# Patient Record
Sex: Male | Born: 1975 | Race: Black or African American | Hispanic: No | Marital: Single | Smoking: Never smoker
Health system: Southern US, Community
[De-identification: ages and names within clinical notes are randomized; demographics above are authoritative.]

## PROBLEM LIST (undated history)

## (undated) DIAGNOSIS — F909 Attention-deficit hyperactivity disorder, unspecified type: Secondary | ICD-10-CM

## (undated) DIAGNOSIS — E079 Disorder of thyroid, unspecified: Secondary | ICD-10-CM

## (undated) DIAGNOSIS — E039 Hypothyroidism, unspecified: Secondary | ICD-10-CM

## (undated) DIAGNOSIS — I10 Essential (primary) hypertension: Secondary | ICD-10-CM

## (undated) HISTORY — DX: Essential (primary) hypertension: I10

## (undated) HISTORY — DX: Disorder of thyroid, unspecified: E07.9

## (undated) HISTORY — PX: OTHER SURGICAL HISTORY: SHX169

---

## 1999-09-27 ENCOUNTER — Emergency Department (HOSPITAL_COMMUNITY): Admission: EM | Admit: 1999-09-27 | Discharge: 1999-09-27 | Payer: Self-pay | Admitting: Emergency Medicine

## 2008-07-31 ENCOUNTER — Emergency Department (HOSPITAL_COMMUNITY): Admission: EM | Admit: 2008-07-31 | Discharge: 2008-07-31 | Payer: Self-pay | Admitting: Emergency Medicine

## 2011-02-14 ENCOUNTER — Emergency Department (HOSPITAL_COMMUNITY)
Admission: EM | Admit: 2011-02-14 | Discharge: 2011-02-14 | Disposition: A | Discharge status: Home / Self Care | Payer: BC Managed Care – PPO | Attending: Emergency Medicine | Admitting: Emergency Medicine

## 2011-02-14 DIAGNOSIS — S61209A Unspecified open wound of unspecified finger without damage to nail, initial encounter: Secondary | ICD-10-CM | POA: Insufficient documentation

## 2011-02-14 DIAGNOSIS — I10 Essential (primary) hypertension: Secondary | ICD-10-CM | POA: Insufficient documentation

## 2011-02-14 DIAGNOSIS — S51809A Unspecified open wound of unspecified forearm, initial encounter: Secondary | ICD-10-CM | POA: Insufficient documentation

## 2011-02-14 DIAGNOSIS — W540XXA Bitten by dog, initial encounter: Secondary | ICD-10-CM | POA: Insufficient documentation

## 2011-04-08 ENCOUNTER — Ambulatory Visit (INDEPENDENT_AMBULATORY_CARE_PROVIDER_SITE_OTHER): Payer: BC Managed Care – PPO | Admitting: Surgery

## 2011-04-08 ENCOUNTER — Other Ambulatory Visit (INDEPENDENT_AMBULATORY_CARE_PROVIDER_SITE_OTHER): Payer: Self-pay | Admitting: Surgery

## 2011-04-08 ENCOUNTER — Encounter (INDEPENDENT_AMBULATORY_CARE_PROVIDER_SITE_OTHER): Payer: Self-pay | Admitting: Surgery

## 2011-04-08 VITALS — BP 144/102 | HR 108 | Temp 97.2°F | Resp 28 | Ht 74.0 in | Wt 360.0 lb

## 2011-04-08 DIAGNOSIS — L02219 Cutaneous abscess of trunk, unspecified: Secondary | ICD-10-CM

## 2011-04-08 DIAGNOSIS — L02214 Cutaneous abscess of groin: Secondary | ICD-10-CM

## 2011-04-08 MED ORDER — AMOXICILLIN-POT CLAVULANATE 875-125 MG PO TABS: 1.0000 | ORAL_TABLET | Freq: Two times a day (BID) | ORAL | Status: AC

## 2011-04-08 NOTE — Patient Instructions (Signed)
We will start you on some antibiotics today. He will need to come back tomorrow for my nurse to change her dressing. We will likely have to see her next week as well.

## 2011-04-08 NOTE — Progress Notes (Signed)
  CC: Abscess HPI: The patient comes over from urgent care with a left groin abscess. He was seen here yesterday and started on an antibiotic injection. He was not given any oral antibiotics. A sterile dressing was applied he was sent here he notes he said continued foul-smelling drainage from the area. As on the left side really in the inguinal crease somewhat posterior to base of the scrotum. He's not had any systemic symptoms.   ROS: He has hypertension but other than that his review of systems is negative.  MEDS: Current Outpatient Prescriptions  Medication Sig Dispense Refill  . allopurinol (ZYLOPRIM) 300 MG tablet daily.      Marland Kitchen amLODipine-benazepril (LOTREL) 5-40 MG per capsule           ALLERGIES: Allergies  Allergen Reactions  . Micardis (Telmisartan)       PE General: The patient alert oriented and healthy-appearing. He is morbidly obese.  Groin: He has an indurated area in the left groin that is draining purulent material. It is in the inguinal crease fairly posteriorly basically at the base of the scrotum and junction with the thigh. There is some mild induration around this..  Data Reviewed No data available  Assessment Abscess and left groin area. I think this is related to air follicle or cysts  Plan I recommended that we drain this. He was agreeable.  Procedure note: The area is anesthetized with about 8 cc of 1% Xylocaine with epinephrine. I waited about 10 minutes. I had excellent anesthesia. The area was prepped with Betadine. I made an elliptical incision opened up the area. Tract a little bit inferiorly posteriorly and superiorly along the inguinal canal. It was beaded [place] anticipated I went ahead and opened up the skin for several more centimeters so I had wide open drainage.  A culture was taken  The wound is packed. Going to start him on Augmentin 875 b.i.d.

## 2011-04-09 ENCOUNTER — Ambulatory Visit (INDEPENDENT_AMBULATORY_CARE_PROVIDER_SITE_OTHER): Payer: BC Managed Care – PPO | Admitting: General Surgery

## 2011-04-09 VITALS — BP 130/78 | HR 70 | Temp 97.1°F | Resp 16 | Ht 73.0 in | Wt 358.0 lb

## 2011-04-09 DIAGNOSIS — L02214 Cutaneous abscess of groin: Secondary | ICD-10-CM

## 2011-04-09 DIAGNOSIS — L02219 Cutaneous abscess of trunk, unspecified: Secondary | ICD-10-CM

## 2011-04-09 NOTE — Patient Instructions (Signed)
Continue antibiotics.  Change gauze as needed

## 2011-04-09 NOTE — Progress Notes (Signed)
Subjective:     Patient ID: Francisco Green, male   DOB: 1976-01-05, 35 y.o.   MRN: [id number]  HPI patient presents for followup status post incision and drainage of left groin abscess yesterday. He has been taking Augmentin. He continues to have some drainage.  Review of Systems     Objective:   Physical Exam Packing was removed. It was cleaned out. There is no significant ongoing induration. There is no erythema. There continues to be some purulent drainage. A new wet-to-dry dressing was placed.    Assessment:     Left groin abscess improving after incision and drainage    Plan:     Continue antibiotics. We will have the patient to Dr. Zachery Dakins tomorrow morning. After that I believe the patient will be able to handle his dressing changes.

## 2011-04-10 ENCOUNTER — Ambulatory Visit (INDEPENDENT_AMBULATORY_CARE_PROVIDER_SITE_OTHER): Payer: BC Managed Care – PPO | Admitting: Surgery

## 2011-04-10 ENCOUNTER — Encounter (INDEPENDENT_AMBULATORY_CARE_PROVIDER_SITE_OTHER): Payer: Self-pay | Admitting: Surgery

## 2011-04-10 ENCOUNTER — Encounter (INDEPENDENT_AMBULATORY_CARE_PROVIDER_SITE_OTHER): Payer: BC Managed Care – PPO | Admitting: General Surgery

## 2011-04-10 VITALS — BP 122/86 | HR 96 | Temp 97.0°F | Resp 20 | Ht 74.0 in | Wt 356.0 lb

## 2011-04-10 DIAGNOSIS — L02219 Cutaneous abscess of trunk, unspecified: Secondary | ICD-10-CM

## 2011-04-10 DIAGNOSIS — L02214 Cutaneous abscess of groin: Secondary | ICD-10-CM

## 2011-04-10 NOTE — Patient Instructions (Signed)
We will need to recheck you in the office Monday. You may shower and change the dressing as needed. Continue your antibiotics

## 2011-04-10 NOTE — Progress Notes (Signed)
The patient comes back in followup his left groin abscess. This was located in the groin crease at the bottom of the scrotum on the left side. He was seen yesterday for followup and comes back for a second followup today. It was drained two days ago.  Patient feels much better than he did on Wednesday when we drained the abscess. He continues to have foul-smelling drainage.  Exam: Gen.: The patient is alert and comfortable and in no distress vital signs normal. Groin: There is a draining abscess in the groin area. The pocket is about two or 3 cm deep. However, I think the incision has a widely open and this should gradually resolve. Data reviewed culture shows abundant gram-negative rods and a few gram-positive rods and gram-positive cocci in pairs. Impression: Gradually improving status post drainage of groin abscess  Plan: He will do dressing changes as needed over the weekend and he needs to be rechecked on Monday. We'll also have final culture reports them.

## 2011-04-11 LAB — WOUND CULTURE

## 2011-04-13 ENCOUNTER — Ambulatory Visit (INDEPENDENT_AMBULATORY_CARE_PROVIDER_SITE_OTHER): Payer: BC Managed Care – PPO | Admitting: Surgery

## 2011-04-13 ENCOUNTER — Encounter (INDEPENDENT_AMBULATORY_CARE_PROVIDER_SITE_OTHER): Payer: Self-pay | Admitting: Surgery

## 2011-04-13 VITALS — BP 124/81 | HR 70 | Temp 97.5°F | Resp 13 | Ht 74.0 in | Wt 363.0 lb

## 2011-04-13 DIAGNOSIS — L02219 Cutaneous abscess of trunk, unspecified: Secondary | ICD-10-CM

## 2011-04-13 DIAGNOSIS — L02214 Cutaneous abscess of groin: Secondary | ICD-10-CM

## 2011-04-13 NOTE — Progress Notes (Signed)
Subjective:     Patient ID: Francisco Green, male   DOB: [birth year], 36 y.o.   MRN: [id number]  HPI He has no complaints today. He reports no groin pain or fevers. He is finishing his antibiotics. He has had no issues with dressing changes  Review of Systems     Objective:   Physical Exam On exam, his wound is well healing with excellent granulation tissue and no purulence. The wound cultures were nonspecific    Assessment:     Left groin abscess status post incision and drainage    Plan:     He will continue his current wound care and finish his antibiotics. He will see [place] back as needed.

## 2011-04-17 ENCOUNTER — Encounter (INDEPENDENT_AMBULATORY_CARE_PROVIDER_SITE_OTHER): Payer: BC Managed Care – PPO | Admitting: Surgery

## 2012-09-26 ENCOUNTER — Other Ambulatory Visit: Payer: Self-pay | Admitting: Endocrinology

## 2012-09-26 DIAGNOSIS — E041 Nontoxic single thyroid nodule: Secondary | ICD-10-CM

## 2012-10-03 ENCOUNTER — Other Ambulatory Visit: Payer: BC Managed Care – PPO

## 2012-10-31 ENCOUNTER — Ambulatory Visit
Admission: RE | Admit: 2012-10-31 | Discharge: 2012-10-31 | Disposition: A | Discharge status: Home / Self Care | Payer: BC Managed Care – PPO | Source: Ambulatory Visit | Attending: Endocrinology | Admitting: Endocrinology

## 2012-10-31 DIAGNOSIS — E041 Nontoxic single thyroid nodule: Secondary | ICD-10-CM

## 2012-11-03 ENCOUNTER — Other Ambulatory Visit: Payer: Self-pay | Admitting: Endocrinology

## 2012-11-03 DIAGNOSIS — E041 Nontoxic single thyroid nodule: Secondary | ICD-10-CM

## 2013-01-10 ENCOUNTER — Ambulatory Visit
Admission: RE | Admit: 2013-01-10 | Discharge: 2013-01-10 | Disposition: A | Discharge status: Home / Self Care | Payer: BC Managed Care – PPO | Source: Ambulatory Visit | Attending: Endocrinology | Admitting: Endocrinology

## 2013-01-10 ENCOUNTER — Other Ambulatory Visit (HOSPITAL_COMMUNITY)
Admission: RE | Admit: 2013-01-10 | Discharge: 2013-01-10 | Disposition: A | Discharge status: Home / Self Care | Payer: BC Managed Care – PPO | Source: Ambulatory Visit | Attending: Interventional Radiology | Admitting: Interventional Radiology

## 2013-01-10 DIAGNOSIS — E049 Nontoxic goiter, unspecified: Secondary | ICD-10-CM | POA: Insufficient documentation

## 2013-01-10 DIAGNOSIS — E041 Nontoxic single thyroid nodule: Secondary | ICD-10-CM

## 2013-01-12 ENCOUNTER — Other Ambulatory Visit: Payer: BC Managed Care – PPO

## 2013-09-21 ENCOUNTER — Encounter (INDEPENDENT_AMBULATORY_CARE_PROVIDER_SITE_OTHER): Payer: Self-pay | Admitting: General Surgery

## 2013-09-21 ENCOUNTER — Other Ambulatory Visit (INDEPENDENT_AMBULATORY_CARE_PROVIDER_SITE_OTHER): Payer: Self-pay | Admitting: General Surgery

## 2013-09-21 ENCOUNTER — Ambulatory Visit (INDEPENDENT_AMBULATORY_CARE_PROVIDER_SITE_OTHER): Payer: BC Managed Care – PPO | Admitting: General Surgery

## 2013-09-21 ENCOUNTER — Encounter (INDEPENDENT_AMBULATORY_CARE_PROVIDER_SITE_OTHER): Payer: Self-pay

## 2013-09-21 VITALS — BP 148/88 | HR 88 | Temp 98.8°F | Resp 20 | Ht 74.0 in | Wt 382.0 lb

## 2013-09-21 DIAGNOSIS — M109 Gout, unspecified: Secondary | ICD-10-CM | POA: Insufficient documentation

## 2013-09-21 DIAGNOSIS — I10 Essential (primary) hypertension: Secondary | ICD-10-CM

## 2013-09-21 DIAGNOSIS — E049 Nontoxic goiter, unspecified: Secondary | ICD-10-CM

## 2013-09-21 DIAGNOSIS — E039 Hypothyroidism, unspecified: Secondary | ICD-10-CM | POA: Insufficient documentation

## 2013-09-21 NOTE — Patient Instructions (Signed)
We will start our process for workup Please watch EMMI video on gastric bypass  Please call me with any questions

## 2013-09-22 NOTE — Progress Notes (Signed)
Patient ID: Francisco Green, male   DOB: 02/28/1976, 38 y.o.   MRN: [id number]  Chief Complaint  Patient presents with  . Weight Loss Surgery    initial bariatric possible bypass    HPI Francisco PryKavon K Fatica is a 38 y.o. male.   HPI 38 year old morbidly obese African American male referred by Dr. Juleen ChinaKohut for evaluation of weight loss surgery. The patient states that he has struggled with his weight most of his adult life. Despite numerous attempts were sustained weight loss he has been unsuccessful. He has tried [employer] diet, personal training, and Nutrisystem all without any long-term success. He is primarily interested in the gastric bypass because it does not require adjustment like a lap band procedure. He also has had friends who have had a gastric bypass with good outcomes.  His comorbidities include hypertension, gout, mild reflux, Hypothyroidism with goiter Past Medical History  Diagnosis Date  . Hypertension   . Gout   . Thyroid disease     Past Surgical History  Procedure Laterality Date  . Groin abs      History reviewed. No pertinent family history.  Social History History  Substance Use Topics  . Smoking status: Never Smoker   . Smokeless tobacco: Never Used  . Alcohol Use: No    Allergies  Allergen Reactions  . Micardis [Telmisartan]     Current Outpatient Prescriptions  Medication Sig Dispense Refill  . allopurinol (ZYLOPRIM) 300 MG tablet daily.      Marland Kitchen. amLODipine-benazepril (LOTREL) 5-40 MG per capsule       . levothyroxine (SYNTHROID, LEVOTHROID) 88 MCG tablet Take 88 mcg by mouth daily before breakfast.       No current facility-administered medications for this visit.    Review of Systems Review of Systems  Constitutional: Negative for fever, chills, appetite change and unexpected weight change.  HENT: Negative for congestion and trouble swallowing.   Eyes: Negative for visual disturbance.  Respiratory: Negative for chest tightness and shortness of  breath.        Epworth score 7  Cardiovascular: Negative for chest pain and leg swelling.       No PND, no orthopnea, no DOE; elevated lipids in past but no meds  Gastrointestinal: Negative for nausea, vomiting, abdominal pain, diarrhea and constipation.       Occasional GERD but no Rx meds.   Genitourinary: Negative for dysuria and hematuria.  Musculoskeletal: Negative.   Skin: Negative for rash.  Neurological: Negative for seizures and speech difficulty.  Hematological: Does not bruise/bleed easily.  Psychiatric/Behavioral: Negative for behavioral problems and confusion.    Blood pressure 148/88, pulse 88, temperature 98.8 F (37.1 C), temperature source Temporal, resp. rate 20, height 6\' 2"  (1.88 m), weight 382 lb (173.274 kg).  Physical Exam Physical Exam  Vitals reviewed. Constitutional: He is oriented to person, place, and time. He appears well-developed and well-nourished. No distress.  Morbidly obese, evenly distributed  HENT:  Head: Normocephalic and atraumatic.  Right Ear: External ear normal.  Left Ear: External ear normal.  Eyes: Conjunctivae are normal. No scleral icterus.  Neck: Normal range of motion. Neck supple. No tracheal deviation present. No thyromegaly present.  Cardiovascular: Normal rate, normal heart sounds and intact distal pulses.   Pulmonary/Chest: Effort normal and breath sounds normal. No respiratory distress. He has no wheezes.  Musculoskeletal: Normal range of motion. He exhibits no edema and no tenderness.  Lymphadenopathy:    He has no cervical adenopathy.  Neurological: He is alert and  oriented to person, place, and time. He exhibits normal muscle tone.  Skin: Skin is warm and dry. No rash noted. He is not diaphoretic. No erythema. No pallor.  Psychiatric: He has a normal mood and affect. His behavior is normal. Judgment and thought content normal.    Data Reviewed U/s neck and thyroid bx - goiter  Assessment    Morbid obesity BMI  49 Hypertension Gout Hypothyroidism Goiter     Plan    The patient meets weight loss surgery criteria. I think the patient would be an acceptable candidate for Laparoscopic Roux-en-Y Gastric bypass.   We discussed laparoscopic Roux-en-Y gastric bypass. We discussed the preoperative, operative and postoperative process. Using diagrams, I explained the surgery in detail including the performance of an EGD near the end of the surgery and an Upper GI swallow study on POD 1. We discussed the typical hospital course including a 2-3 day stay baring any complications.   The patient was given educational material. I quoted the patient that they can expect to lose 50-70% of their excess weight with the gastric bypass. We did discuss the possibility of weight regain several years after the procedure.  We discussed the risk and benefits of surgery including but not limited to anesthesia risk, bleeding, infection, anastomotic edema requiring a few additional days in the hospital, postop nausea, possible conversion to open procedure, blood clot formation, anastomotic leak, anastomotic stricture, ulcer formation, death, respiratory complications, intestinal blockage, internal hernia, gallstone formation, vitamin and nutritional deficiencies, hair loss, weight regain injury to surrounding structures, failure to lose weight and mood changes.  We discussed that before and after surgery that there would be an alteration in their diet. I explained that we have put them on a diet 2 weeks before surgery. I also explained that they would be on a liquid diet for 2 weeks after surgery. We discussed that they would have to avoid certain foods such as sugar after surgery. We discussed the importance of physical activity as well as compliance with our dietary and supplement recommendations and routine follow-up.  I explained to the patient that we will start our evaluation process which includes labs, Upper GI to evaluate  stomach and swallowing anatomy, nutritionist consultation, psychiatrist consultation, EKG, CXR, abdominal ultrasound, repeat neck ultrasound. He was also given access to watch the EMMI video on laparoscopic Roux-en-Y gastric bypass.  Mary Sella. Andrey Campanile, MD, FACS General, Bariatric, & Minimally Invasive Surgery [facility], [place]         [facility] M 09/22/2013, 3:44 PM

## 2013-09-26 ENCOUNTER — Other Ambulatory Visit: Payer: BC Managed Care – PPO

## 2013-09-30 LAB — LIPID PANEL
Cholesterol: 190 mg/dL (ref 0–200)
HDL: 40 mg/dL (ref >39)
LDL CALC: 132 mg/dL — AB (ref 0–99)
Total CHOL/HDL Ratio: 4.8 Ratio
Triglycerides: 91 mg/dL (ref <150)
VLDL: 18 mg/dL (ref 0–40)

## 2013-09-30 LAB — CBC
HEMATOCRIT: 43.7 % (ref 39.0–52.0)
Hemoglobin: 14 g/dL (ref 13.0–17.0)
MCH: 22.9 pg — ABNORMAL LOW (ref 26.0–34.0)
MCHC: 32 g/dL (ref 30.0–36.0)
MCV: 71.4 fL — ABNORMAL LOW (ref 78.0–100.0)
Platelets: 126 10*3/uL — ABNORMAL LOW (ref 150–400)
RBC: 6.12 MIL/uL — ABNORMAL HIGH (ref 4.22–5.81)
RDW: 13.8 % (ref 11.5–15.5)
WBC: 3.6 10*3/uL — AB (ref 4.0–10.5)

## 2013-09-30 LAB — T4: T4 TOTAL: 12.5 ug/dL (ref 5.0–12.5)

## 2013-09-30 LAB — TSH: TSH: 0.486 u[IU]/mL (ref 0.350–4.500)

## 2013-10-02 LAB — H. PYLORI ANTIBODY, IGG: H PYLORI IGG: 0.49 {ISR}

## 2013-10-23 ENCOUNTER — Ambulatory Visit
Admission: RE | Admit: 2013-10-23 | Discharge: 2013-10-23 | Disposition: A | Discharge status: Home / Self Care | Payer: BC Managed Care – PPO | Source: Ambulatory Visit | Attending: General Surgery | Admitting: General Surgery

## 2013-10-24 ENCOUNTER — Ambulatory Visit (HOSPITAL_COMMUNITY)
Admission: RE | Admit: 2013-10-24 | Discharge: 2013-10-24 | Disposition: A | Discharge status: Home / Self Care | Payer: BC Managed Care – PPO | Source: Ambulatory Visit | Attending: General Surgery | Admitting: General Surgery

## 2013-10-24 ENCOUNTER — Other Ambulatory Visit: Payer: Self-pay

## 2013-10-24 DIAGNOSIS — Z6841 Body Mass Index (BMI) 40.0 and over, adult: Secondary | ICD-10-CM | POA: Insufficient documentation

## 2013-10-24 DIAGNOSIS — K449 Diaphragmatic hernia without obstruction or gangrene: Secondary | ICD-10-CM | POA: Insufficient documentation

## 2013-10-24 DIAGNOSIS — K7689 Other specified diseases of liver: Secondary | ICD-10-CM | POA: Insufficient documentation

## 2013-10-24 DIAGNOSIS — E049 Nontoxic goiter, unspecified: Secondary | ICD-10-CM | POA: Insufficient documentation

## 2013-10-24 DIAGNOSIS — E039 Hypothyroidism, unspecified: Secondary | ICD-10-CM | POA: Insufficient documentation

## 2013-10-24 DIAGNOSIS — K224 Dyskinesia of esophagus: Secondary | ICD-10-CM | POA: Insufficient documentation

## 2013-10-24 DIAGNOSIS — M109 Gout, unspecified: Secondary | ICD-10-CM | POA: Insufficient documentation

## 2013-10-24 DIAGNOSIS — I10 Essential (primary) hypertension: Secondary | ICD-10-CM | POA: Insufficient documentation

## 2013-11-01 ENCOUNTER — Ambulatory Visit: Payer: BC Managed Care – PPO | Admitting: Dietician

## 2014-04-07 ENCOUNTER — Emergency Department (HOSPITAL_COMMUNITY)
Admission: EM | Admit: 2014-04-07 | Discharge: 2014-04-07 | Disposition: A | Discharge status: Home / Self Care | Payer: BC Managed Care – PPO | Attending: Emergency Medicine | Admitting: Emergency Medicine

## 2014-04-07 ENCOUNTER — Encounter (HOSPITAL_COMMUNITY): Payer: Self-pay | Admitting: Emergency Medicine

## 2014-04-07 DIAGNOSIS — Z79899 Other long term (current) drug therapy: Secondary | ICD-10-CM | POA: Diagnosis not present

## 2014-04-07 DIAGNOSIS — R6889 Other general symptoms and signs: Secondary | ICD-10-CM | POA: Insufficient documentation

## 2014-04-07 DIAGNOSIS — I10 Essential (primary) hypertension: Secondary | ICD-10-CM | POA: Insufficient documentation

## 2014-04-07 DIAGNOSIS — E079 Disorder of thyroid, unspecified: Secondary | ICD-10-CM | POA: Insufficient documentation

## 2014-04-07 DIAGNOSIS — J392 Other diseases of pharynx: Secondary | ICD-10-CM

## 2014-04-07 DIAGNOSIS — M109 Gout, unspecified: Secondary | ICD-10-CM | POA: Diagnosis not present

## 2014-04-07 MED ORDER — GI COCKTAIL ~~LOC~~
30.0000 mL | Freq: Once | ORAL | Status: AC
  Administered 2014-04-07: 30 mL via ORAL
  Filled 2014-04-07: qty 30

## 2014-04-07 NOTE — ED Provider Notes (Signed)
CSN: [id number]     Arrival date & time 04/07/14  2110 History   First MD Initiated Contact with Patient 04/07/14 2147     Chief Complaint  Patient presents with  . Foreign Body     (Consider location/radiation/quality/duration/timing/severity/associated sxs/prior Treatment) HPI Francisco Green is a 38 y.o. male who comes in for evaluation after swallowing his blood pressure pill and feeling like it got lodged in his throat. Patient states he took his blood pressure pill around 8:45 PM this evening he just felt like it got turned sideways his throat and got lodged there. He is not having any difficulties breathing, he is speaking in full sentences without discomfort or difficulty, he is tolerating his secretions well. Denies any fevers, nausea vomiting, shortness of breath, abdominal pain, numbness or tingling.    Past Medical History  Diagnosis Date  . Hypertension   . Gout   . Thyroid disease    Past Surgical History  Procedure Laterality Date  . Groin abs     No family history on file. History  Substance Use Topics  . Smoking status: Never Smoker   . Smokeless tobacco: Never Used  . Alcohol Use: No    Review of Systems  Constitutional: Negative for fever.  HENT: Positive for trouble swallowing.   Respiratory: Negative for shortness of breath.   Cardiovascular: Negative for chest pain.  Skin: Negative for rash.      Allergies  Micardis  Home Medications   Prior to Admission medications   Medication Sig Start Date End Date Taking? Authorizing Provider  allopurinol (ZYLOPRIM) 300 MG tablet Take 300 mg by mouth daily.  03/08/11  Yes Historical Provider, MD  amLODipine-benazepril (LOTREL) 5-40 MG per capsule Take 1 capsule by mouth daily.  03/08/11  Yes Historical Provider, MD  levothyroxine (SYNTHROID, LEVOTHROID) 88 MCG tablet Take 88 mcg by mouth daily before breakfast.   Yes Historical Provider, MD   BP 170/102  Pulse 91  Temp(Src) 99.3 F (37.4 C) (Oral)  Resp  20  Ht  (1.88 m)  Wt 382 lb (173.274 kg)  BMI 49.03 kg/m2  SpO2 100% Physical Exam  Nursing note and vitals reviewed. Constitutional:  Awake, alert, nontoxic appearance.  HENT:  Head: Atraumatic.  Mouth/Throat: Oropharynx is clear and moist. No oropharyngeal exudate.  No evidence of posterior oropharynx swelling. No evidence of pill or foreign body visible on inspection. No signs of trauma.  Eyes: Right eye exhibits no discharge. Left eye exhibits no discharge.  Neck: Neck supple.  Pulmonary/Chest: Effort normal. He exhibits no tenderness.  Abdominal: Soft. There is no tenderness. There is no rebound.  Musculoskeletal: He exhibits no tenderness.  Baseline ROM, no obvious new focal weakness.  Neurological:  Mental status and motor strength appears baseline for patient and situation.  Skin: No rash noted.  Psychiatric: He has a normal mood and affect.    ED Course  Procedures (including critical care time) Labs Review Labs Reviewed - No data to display  Imaging Review No results found.   EKG Interpretation None     Meds given in ED:  Medications  gi cocktail (Maalox,Lidocaine,Donnatal) (30 mLs Oral Given 04/07/14 2202)    New Prescriptions   No medications on file   Filed Vitals:   04/07/14 2126  BP: 170/102  Pulse: 91  Temp: 99.3 F (37.4 C)  TempSrc: Oral  Resp: 20  Height:  (1.88 m)  Weight: 382 lb (173.274 kg)  SpO2: 100%  MDM  Vitals stable - WNL -afebrile Pt resting comfortably in ED. patient given GI cocktail, states he feels much better. He no longer feels like anything is lodged in his throat and he asks if he can go home now. He is drinking water in his room without any difficulty or discomfort. PE not concerning for obstructive foreign body. No evidence of respiratory compromise Discussed f/u with PCP and return precautions, pt very amenable to plan.   Final diagnoses:  Throat irritation   Prior to patient discharge, I discussed  and reviewed this case with Dr.Harrison         Sharlene Motts, PA-C 04/08/14 1035

## 2014-04-07 NOTE — Discharge Instructions (Signed)
You were seen in the ED for evaluation of throat discomfort after swallowing a pill. Your condition improved while in the ED. There is no evidence of respiratory compromise. If you begin to feel short of breath chest pain or fevers return to ED for further evaluation

## 2014-04-07 NOTE — ED Notes (Addendum)
Pt states he was attempting to take his medication this evening 2100, felt pill get stuck in throat, pt states he has had this happen in the past d/t enlarged Thyroid. Pt handling secretions and is able to swallow water. No airway involvement

## 2014-04-08 NOTE — ED Provider Notes (Signed)
Medical screening examination/treatment/procedure(s) were conducted as a shared visit with non-physician practitioner(s) and myself.  I personally evaluated the patient during the encounter.   EKG Interpretation None      I interviewed and examined the patient. Lungs are CTAB. Cardiac exam wnl. Abdomen soft.  Pt well appearing, no acute distress, likely pill esophagitis. Will recommend symptomatic mgmt.   Purvis Sheffield, MD 04/08/14 1051

## 2016-07-20 HISTORY — PX: OTHER SURGICAL HISTORY: SHX169

## 2016-09-02 ENCOUNTER — Other Ambulatory Visit: Payer: Self-pay | Admitting: Endocrinology

## 2016-09-02 DIAGNOSIS — E041 Nontoxic single thyroid nodule: Secondary | ICD-10-CM

## 2016-09-29 ENCOUNTER — Inpatient Hospital Stay: Admission: RE | Admit: 2016-09-29 | Payer: Self-pay | Source: Ambulatory Visit

## 2016-12-01 ENCOUNTER — Encounter (HOSPITAL_COMMUNITY): Payer: Self-pay | Admitting: *Deleted

## 2016-12-01 ENCOUNTER — Ambulatory Visit (HOSPITAL_COMMUNITY)
Admission: EM | Admit: 2016-12-01 | Discharge: 2016-12-01 | Disposition: A | Discharge status: Home / Self Care | Payer: 59 | Attending: Internal Medicine | Admitting: Internal Medicine

## 2016-12-01 DIAGNOSIS — M7711 Lateral epicondylitis, right elbow: Secondary | ICD-10-CM | POA: Diagnosis not present

## 2016-12-01 MED ORDER — MELOXICAM 15 MG PO TABS: 15.0000 mg | ORAL_TABLET | Freq: Every day | ORAL | 2 refills | Status: DC

## 2016-12-01 NOTE — ED Triage Notes (Signed)
Patient states right intermittent elbow pain with movement and palpation, no injury. CMS intact distally to pain.

## 2016-12-01 NOTE — ED Provider Notes (Signed)
CSN: [id number]     Arrival date & time 12/01/16  1420 History   First MD Initiated Contact with Patient 12/01/16 1542     Chief Complaint  Patient presents with  . Elbow Pain   (Consider location/radiation/quality/duration/timing/severity/associated sxs/prior Treatment) 41 year old male with past history of gout, hypertension, thyroid disease, and obesity presents to clinic with right elbow pain, ongoing for 2 weeks. States the pain is been worsening, needs noticed some swelling to the elbow. He denies any traumatic cause, such as falls, MVC, assault, or other injuries. 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 Pain subsides over the weekends, and worsens over the course of the week. It is relieved with ibuprofen, and worsened with movement.   The history is provided by the patient.    Past Medical History:  Diagnosis Date  . Gout   . Hypertension   . Thyroid disease    Past Surgical History:  Procedure Laterality Date  . groin abs     Family History  Problem Relation Age of Onset  . Hypertension Mother   . Diabetes Father   . Hypertension Father    Social History  Substance Use Topics  . Smoking status: Never Smoker  . Smokeless tobacco: Never Used  . Alcohol use No    Review of Systems  Constitutional: Negative.   HENT: Negative.   Respiratory: Negative.   Cardiovascular: Negative.   Gastrointestinal: Negative.   Musculoskeletal: Positive for joint swelling.  Skin: Negative.   Neurological: Negative.     Allergies  Micardis [telmisartan]  Home Medications   Prior to Admission medications   Medication Sig Start Date End Date Taking? Authorizing Provider  allopurinol (ZYLOPRIM) 300 MG tablet Take 300 mg by mouth daily.  03/08/11  Yes [provider]  amLODipine-benazepril (LOTREL) 5-40 MG per capsule Take 1 capsule by mouth daily.  03/08/11  Yes [provider]  atorvastatin (LIPITOR) 10  MG tablet Take 10 mg by mouth daily.   Yes [provider]  levothyroxine (SYNTHROID, LEVOTHROID) 88 MCG tablet Take 88 mcg by mouth daily before breakfast.   Yes [provider]  meloxicam (MOBIC) 15 MG tablet Take 1 tablet (15 mg total) by mouth daily. 12/01/16   Dorena BodoKennard, Ilhan Debenedetto, NP   Meds Ordered and Administered this Visit  Medications - No data to display  Pulse 94   Temp 98.4 F (36.9 C) (Oral)   Resp 17   SpO2 97%  No data found.   Physical Exam  Constitutional: He is oriented to person, place, and time. He appears well-developed and well-nourished. No distress.  HENT:  Head: Normocephalic and atraumatic.  Right Ear: External ear normal.  Left Ear: External ear normal.  Eyes: Conjunctivae are normal.  Neck: Normal range of motion.  Musculoskeletal: He exhibits tenderness.  Tenderness to palpation over the lateral epicondyle, no deformity, restrictions in range of motion, pulse, motor, sensory function remains intact distally, capillary refill less than 2 seconds.  Neurological: He is alert and oriented to person, place, and time.  Skin: Skin is warm and dry. Capillary refill takes less than 2 seconds. He is not diaphoretic.  Psychiatric: He has a normal mood and affect. His behavior is normal.  Nursing note and vitals reviewed.   Urgent Care Course     Procedures (including critical care time)  Labs Review Labs Reviewed - No data to display  Imaging Review No results found.  MDM   1. Lateral epicondylitis of right elbow    Recommend rest, ice, elevation when possible, started on meloxicam. Recommend following up with orthopedics if pain persists.     Dorena Bodo, NP 12/01/16 1600

## 2016-12-01 NOTE — Discharge Instructions (Signed)
You have lateral epicondylititis, this is a repetitive motion type injury. I recommend rest, ice, 15 minutes at a time for more times a day, and I prescribed a medicine called meloxicam as an anti-inflammatory, take one tablet once daily. Provided some rehabilitation guidelines, if her pain persists past 2 weeks, follow up with an orthopedist.

## 2019-10-20 ENCOUNTER — Ambulatory Visit: Payer: 59 | Attending: Internal Medicine

## 2019-10-20 DIAGNOSIS — Z23 Encounter for immunization: Secondary | ICD-10-CM

## 2019-10-20 NOTE — Progress Notes (Signed)
   Covid-19 Vaccination Clinic  Name:  SAVIER TRICKETT    MRN: [id number] DOB: 02-Apr-1976  10/20/2019  Mr. Weatherly was observed post Covid-19 immunization for 15 minutes without incident. He was provided with Vaccine Information Sheet and instruction to access the V-Safe system.   Mr. Bulthuis was instructed to call 911 with any severe reactions post vaccine: Marland Kitchen Difficulty breathing  . Swelling of face and throat  . A fast heartbeat  . A bad rash all over body  . Dizziness and weakness   Immunizations Administered    Name Date Dose VIS Date Route   Pfizer COVID-19 Vaccine 10/20/2019  9:31 AM 0.3 mL 06/30/2019 Intramuscular   Manufacturer: [employer], [employer]   Lot: [id number]   NDC: [address]-9

## 2019-11-14 ENCOUNTER — Ambulatory Visit: Payer: 59 | Attending: Internal Medicine

## 2019-11-14 DIAGNOSIS — Z23 Encounter for immunization: Secondary | ICD-10-CM

## 2019-11-14 NOTE — Progress Notes (Signed)
   Covid-19 Vaccination Clinic  Name:  Francisco Green    MRN: [id number] DOB: 1976/01/30  11/14/2019  [name] was observed post Covid-19 immunization for 15 minutes without incident. He was provided with Vaccine Information Sheet and instruction to access the V-Safe system.   [name] was instructed to call 911 with any severe reactions post vaccine: Marland Kitchen Difficulty breathing  . Swelling of face and throat  . A fast heartbeat  . A bad rash all over body  . Dizziness and weakness   Immunizations Administered    Name Date Dose VIS Date Route   Pfizer COVID-19 Vaccine 11/14/2019  8:31 AM 0.3 mL 09/13/2018 Intramuscular   Manufacturer: [employer], [employer]   Lot: [id number]   NDC: [address]-3

## 2021-02-18 ENCOUNTER — Ambulatory Visit (HOSPITAL_COMMUNITY)
Admission: EM | Admit: 2021-02-18 | Discharge: 2021-02-18 | Disposition: A | Discharge status: Home / Self Care | Payer: Managed Care, Other (non HMO) | Attending: Emergency Medicine | Admitting: Emergency Medicine

## 2021-02-18 ENCOUNTER — Encounter (HOSPITAL_COMMUNITY): Payer: Self-pay

## 2021-02-18 DIAGNOSIS — J069 Acute upper respiratory infection, unspecified: Secondary | ICD-10-CM

## 2021-02-18 NOTE — ED Triage Notes (Signed)
Pt needs a note to go back to work as he was vomiting. Pt reports having cough x 1 week.

## 2021-02-18 NOTE — ED Provider Notes (Signed)
HPI  SUBJECTIVE:  Francisco Green is a 45 y.o. male who presents for reevaluation after being sick last week.  He needs a note stating that he had a medical evaluation and is okay to return to work.  He reports 1 week of a cough, 1 episode of emesis, body aches, nasal congestion 6 days ago, nausea.  All symptoms resolved 4 days ago, except he still has an occasional cough.  No fevers, headaches, rhinorrhea, sore throat, loss of sense of smell or taste, shortness of breath, diarrhea, abdominal pain.  No known COVID exposure.  He got the second dose of COVID-vaccine.  No antipyretic in the past 6 hours.  He has been resting, pushing fluids and using humidifier with improvement in his symptoms.  No aggravating factors.  He has a past medical history of hypertension, did not take his blood pressure medication today, gout, hypothyroidism.  PMD: [facility].    Past Medical History:  Diagnosis Date   Gout    Hypertension    Thyroid disease     Past Surgical History:  Procedure Laterality Date   groin abs      Family History  Problem Relation Age of Onset   Hypertension Mother    Diabetes Father    Hypertension Father     Social History   Tobacco Use   Smoking status: Never   Smokeless tobacco: Never  Substance Use Topics   Alcohol use: No   Drug use: No    No current facility-administered medications for this encounter.  Current Outpatient Medications:    allopurinol (ZYLOPRIM) 300 MG tablet, Take 300 mg by mouth daily. , Disp: , Rfl:    amLODipine-benazepril (LOTREL) 5-40 MG per capsule, Take 1 capsule by mouth daily. , Disp: , Rfl:    atorvastatin (LIPITOR) 10 MG tablet, Take 10 mg by mouth daily., Disp: , Rfl:    levothyroxine (SYNTHROID, LEVOTHROID) 88 MCG tablet, Take 88 mcg by mouth daily before breakfast., Disp: , Rfl:    meloxicam (MOBIC) 15 MG tablet, Take 1 tablet (15 mg total) by mouth daily., Disp: 30 tablet, Rfl: 2  Allergies  Allergen Reactions   Micardis  [Telmisartan] Other (See Comments)    Sinus drainage causing vomiting     ROS  As noted in HPI.   Physical Exam  BP (!) 153/112 (BP Location: Left Wrist)   Pulse 95   Temp 98.6 F (37 C) (Oral)   Resp 20   SpO2 98%   Constitutional: Well developed, well nourished, no acute distress Eyes:  EOMI, conjunctiva normal bilaterally HENT: Normocephalic, atraumatic,mucus membranes moist Respiratory: Normal inspiratory effort, lungs clear bilaterally Cardiovascular: Normal rate, regular rhythm no murmurs rubs or gallop  GI: nondistended skin: No rash, skin intact Musculoskeletal: no deformities Neurologic: Alert & oriented x 3, no focal neuro deficits Psychiatric: Speech and behavior appropriate   ED Course   Medications - No data to display  No orders of the defined types were placed in this encounter.   No results found for this or any previous visit (from the past 24 hour(s)). No results found.  ED Clinical Impression  1. Viral URI with cough      ED Assessment/Plan  Patient with a recent viral illness.  We discussed doing COVID testing, but because it is too late to do anything about it, we decided to not test today.  We will write a work note stating that he may return to work tomorrow, but that he is to mask  up for an additional 2 days.  Blood pressure also noted, states that he did not take his medication this morning.  States he will take it as soon as he gets home and will keep an eye on this.  Follow-up with PMD as needed.   No orders of the defined types were placed in this encounter.     *This clinic note was created using Dragon dictation software. Therefore, there may be occasional mistakes despite careful proofreading.  ?    Domenick Gong, MD 02/21/21 [phone]

## 2021-06-02 ENCOUNTER — Other Ambulatory Visit: Payer: Self-pay | Admitting: Otolaryngology

## 2021-06-02 DIAGNOSIS — E042 Nontoxic multinodular goiter: Secondary | ICD-10-CM

## 2021-07-08 ENCOUNTER — Ambulatory Visit
Admission: RE | Admit: 2021-07-08 | Discharge: 2021-07-08 | Disposition: A | Discharge status: Home / Self Care | Payer: Managed Care, Other (non HMO) | Source: Ambulatory Visit | Attending: Otolaryngology | Admitting: Otolaryngology

## 2021-07-08 DIAGNOSIS — E042 Nontoxic multinodular goiter: Secondary | ICD-10-CM

## 2021-07-08 MED ORDER — IOPAMIDOL (ISOVUE-300) INJECTION 61%
75.0000 mL | Freq: Once | INTRAVENOUS | Status: AC | PRN
  Administered 2021-07-08: 09:00:00 75 mL via INTRAVENOUS

## 2021-08-05 ENCOUNTER — Other Ambulatory Visit: Payer: Self-pay | Admitting: Otolaryngology

## 2021-08-05 NOTE — Pre-Procedure Instructions (Addendum)
Surgical Instructions    Your procedure is scheduled on Friday, January 20th.  Report to [facility] Main Entrance "A" at 05:30 A.M., then check in with the Admitting office.  Call this number if you have problems the morning of surgery:  [phone]   If you have any questions prior to your surgery date call [phone]: Open Monday-Friday 8am-4pm    Remember:  Do not eat or drink after midnight the night before your surgery      Take these medicines the morning of surgery with A SIP OF WATER  atorvastatin (LIPITOR) levothyroxine (SYNTHROID)   As of today, STOP taking any Aspirin (unless otherwise instructed by your surgeon) Aleve, Naproxen, Ibuprofen, Motrin, Advil, Goody's, BC's, all herbal medications, fish oil, and all vitamins.                     Do NOT Smoke (Tobacco/Vaping) or drink Alcohol 24 hours prior to your procedure.  If you use a CPAP at night, you may bring all equipment for your overnight stay.   Contacts, glasses, piercing's, hearing aid's, dentures or partials may not be worn into surgery, please bring cases for these belongings.    For patients admitted to the hospital, discharge time will be determined by your treatment team.   Patients discharged the day of surgery will not be allowed to drive home, and someone needs to stay with them for 24 hours.  NO VISITORS WILL BE ALLOWED IN PRE-OP WHERE PATIENTS GET READY FOR SURGERY.  ONLY 1 SUPPORT PERSON MAY BE PRESENT IN THE WAITING ROOM WHILE YOU ARE IN SURGERY.  IF YOU ARE TO BE ADMITTED, ONCE YOU ARE IN YOUR ROOM YOU WILL BE ALLOWED TWO (2) VISITORS.  Minor children may have two parents present. Special consideration for safety and communication needs will be reviewed on a case by case basis.   Special instructions:   [facility]- Preparing For Surgery  Before surgery, you can play an important role. Because skin is not sterile, your skin needs to be as free of germs as possible. You can reduce the number  of germs on your skin by washing with CHG (chlorahexidine gluconate) Soap before surgery.  CHG is an antiseptic cleaner which kills germs and bonds with the skin to continue killing germs even after washing.    Oral Hygiene is also important to reduce your risk of infection.  Remember - BRUSH YOUR TEETH THE MORNING OF SURGERY WITH YOUR REGULAR TOOTHPASTE  Please do not use if you have an allergy to CHG or antibacterial soaps. If your skin becomes reddened/irritated stop using the CHG.  Do not shave (including legs and underarms) for at least 48 hours prior to first CHG shower. It is OK to shave your face.  Please follow these instructions carefully.   Shower the NIGHT BEFORE SURGERY and the MORNING OF SURGERY  If you chose to wash your hair, wash your hair first as usual with your normal shampoo.  After you shampoo, rinse your hair and body thoroughly to remove the shampoo.  Use CHG Soap as you would any other liquid soap. You can apply CHG directly to the skin and wash gently with a scrungie or a clean washcloth.   Apply the CHG Soap to your body ONLY FROM THE NECK DOWN.  Do not use on open wounds or open sores. Avoid contact with your eyes, ears, mouth and genitals (private parts). Wash Face and genitals (private parts)  with your normal soap.  Wash thoroughly, paying special attention to the area where your surgery will be performed.  Thoroughly rinse your body with warm water from the neck down.  DO NOT shower/wash with your normal soap after using and rinsing off the CHG Soap.  Pat yourself dry with a CLEAN TOWEL.  Wear CLEAN PAJAMAS to bed the night before surgery  Place CLEAN SHEETS on your bed the night before your surgery  DO NOT SLEEP WITH PETS.   Day of Surgery: Shower with CHG soap. Do not wear jewelry Do not wear lotions, powders, colognes, or deodorant. Men may shave face and neck. Do not bring valuables to the hospital. [facility] is not responsible for any  belongings or valuables. Wear Clean/Comfortable clothing the morning of surgery Remember to brush your teeth WITH YOUR REGULAR TOOTHPASTE.   Please read over the following fact sheets that you were given.   3 days prior to your procedure or After your COVID test   You are not required to quarantine however you are required to wear a well-fitting mask when you are out and around people not in your household. If your mask becomes wet or soiled, replace with a new one.   Wash your hands often with soap and water for 20 seconds or clean your hands with an alcohol-based hand sanitizer that contains at least 60% alcohol.   Do not share personal items.   Notify your provider:  o if you are in close contact with someone who has COVID  o or if you develop a fever of 100.4 or greater, sneezing, cough, sore throat, shortness of breath or body aches.

## 2021-08-06 ENCOUNTER — Encounter (HOSPITAL_COMMUNITY): Payer: Self-pay

## 2021-08-06 ENCOUNTER — Encounter (HOSPITAL_COMMUNITY)
Admission: RE | Admit: 2021-08-06 | Discharge: 2021-08-06 | Disposition: A | Discharge status: Home / Self Care | Payer: Managed Care, Other (non HMO) | Source: Ambulatory Visit | Attending: Otolaryngology | Admitting: Otolaryngology

## 2021-08-06 ENCOUNTER — Other Ambulatory Visit: Payer: Self-pay

## 2021-08-06 VITALS — BP 131/101 | HR 110 | Temp 98.3°F | Resp 18 | Ht 74.0 in | Wt >= 6400 oz

## 2021-08-06 DIAGNOSIS — Z01818 Encounter for other preprocedural examination: Secondary | ICD-10-CM | POA: Insufficient documentation

## 2021-08-06 DIAGNOSIS — I10 Essential (primary) hypertension: Secondary | ICD-10-CM | POA: Insufficient documentation

## 2021-08-06 DIAGNOSIS — Z20822 Contact with and (suspected) exposure to covid-19: Secondary | ICD-10-CM | POA: Insufficient documentation

## 2021-08-06 HISTORY — DX: Hypothyroidism, unspecified: E03.9

## 2021-08-06 HISTORY — DX: Attention-deficit hyperactivity disorder, unspecified type: F90.9

## 2021-08-06 LAB — CBC
HCT: 45.8 % (ref 39.0–52.0)
Hemoglobin: 13.3 g/dL (ref 13.0–17.0)
MCH: 22.8 pg — ABNORMAL LOW (ref 26.0–34.0)
MCHC: 29 g/dL — ABNORMAL LOW (ref 30.0–36.0)
MCV: 78.6 fL — ABNORMAL LOW (ref 80.0–100.0)
Platelets: 188 10*3/uL (ref 150–400)
RBC: 5.83 MIL/uL — ABNORMAL HIGH (ref 4.22–5.81)
RDW: 12.6 % (ref 11.5–15.5)
WBC: 5.2 10*3/uL (ref 4.0–10.5)
nRBC: 0 % (ref 0.0–0.2)

## 2021-08-06 LAB — BASIC METABOLIC PANEL
Anion gap: 12 (ref 5–15)
BUN: 16 mg/dL (ref 6–20)
CO2: 29 mmol/L (ref 22–32)
Calcium: 9.9 mg/dL (ref 8.9–10.3)
Chloride: 98 mmol/L (ref 98–111)
Creatinine, Ser: 1.18 mg/dL (ref 0.61–1.24)
GFR, Estimated: >60 mL/min (ref >60)
Glucose, Bld: 92 mg/dL (ref 70–99)
Potassium: 4.4 mmol/L (ref 3.5–5.1)
Sodium: 139 mmol/L (ref 135–145)

## 2021-08-06 LAB — SARS CORONAVIRUS 2 (TAT 6-24 HRS): SARS Coronavirus 2: NEGATIVE

## 2021-08-06 NOTE — Progress Notes (Signed)
°   08/06/21 1518  OBSTRUCTIVE SLEEP APNEA  Have you ever been diagnosed with sleep apnea through a sleep study? No  Do you snore loudly (loud enough to be heard through closed doors)?  1  Do you often feel tired, fatigued, or sleepy during the daytime (such as falling asleep during driving or talking to someone)? 0  Has anyone observed you stop breathing during your sleep? 0  Do you have, or are you being treated for high blood pressure? 1  BMI more than 35 kg/m2? 1  Age > 50 (1-yes) 0  Neck circumference greater than:Male 16 inches or larger, Male 17inches or larger? 1  Male Gender (Yes=1) 1  Obstructive Sleep Apnea Score 5

## 2021-08-06 NOTE — Progress Notes (Signed)
PCP - Misty Stanley, PA-C Cardiologist - denies  PPM/ICD - denies   Chest x-ray - 10/24/13 EKG - 08/06/21 at PAT Stress Test - denies ECHO - denies Cardiac Cath - denies  Sleep Study - denies, results faxed to PCP for apnea score  DM- denies  Blood Thinner Instructions: n/a Aspirin Instructions: n/a  ERAS Protcol - no, NPO   COVID TEST- 08/06/21 at PAT   Anesthesia review: no  Patient denies shortness of breath, fever, cough and chest pain at PAT appointment   All instructions explained to the patient, with a verbal understanding of the material. Patient agrees to go over the instructions while at home for a better understanding. Patient also instructed to wear a mask in public after being tested for COVID-19. The opportunity to ask questions was provided.

## 2021-08-08 ENCOUNTER — Encounter (HOSPITAL_COMMUNITY): Payer: Self-pay | Admitting: Otolaryngology

## 2021-08-08 ENCOUNTER — Observation Stay (HOSPITAL_COMMUNITY)
Admission: RE | Admit: 2021-08-08 | Discharge: 2021-08-09 | Disposition: A | Discharge status: Home / Self Care | Payer: Managed Care, Other (non HMO) | Attending: Otolaryngology | Admitting: Otolaryngology

## 2021-08-08 ENCOUNTER — Ambulatory Visit (HOSPITAL_COMMUNITY): Payer: Managed Care, Other (non HMO) | Admitting: Anesthesiology

## 2021-08-08 ENCOUNTER — Encounter (HOSPITAL_COMMUNITY)
Admission: RE | Disposition: A | Discharge status: Home / Self Care | Payer: Self-pay | Source: Home / Self Care | Attending: Otolaryngology

## 2021-08-08 DIAGNOSIS — I10 Essential (primary) hypertension: Secondary | ICD-10-CM | POA: Insufficient documentation

## 2021-08-08 DIAGNOSIS — E042 Nontoxic multinodular goiter: Principal | ICD-10-CM | POA: Diagnosis present

## 2021-08-08 DIAGNOSIS — E039 Hypothyroidism, unspecified: Secondary | ICD-10-CM | POA: Diagnosis not present

## 2021-08-08 DIAGNOSIS — Z79899 Other long term (current) drug therapy: Secondary | ICD-10-CM | POA: Diagnosis not present

## 2021-08-08 HISTORY — PX: THYROIDECTOMY: SHX17

## 2021-08-08 LAB — ALBUMIN
Albumin: 3.6 g/dL (ref 3.5–5.0)
Albumin: 3.6 g/dL (ref 3.5–5.0)

## 2021-08-08 LAB — CALCIUM
Calcium: 8.8 mg/dL — ABNORMAL LOW (ref 8.9–10.3)
Calcium: 8.9 mg/dL (ref 8.9–10.3)

## 2021-08-08 SURGERY — THYROIDECTOMY
Anesthesia: General | Site: Neck

## 2021-08-08 MED ORDER — PHENYLEPHRINE 40 MCG/ML (10ML) SYRINGE FOR IV PUSH (FOR BLOOD PRESSURE SUPPORT)
PREFILLED_SYRINGE | INTRAVENOUS | Status: DC | PRN
  Administered 2021-08-08: 120 ug via INTRAVENOUS
  Administered 2021-08-08: 160 ug via INTRAVENOUS
  Administered 2021-08-08: 120 ug via INTRAVENOUS

## 2021-08-08 MED ORDER — KETAMINE HCL 50 MG/5ML IJ SOSY
PREFILLED_SYRINGE | INTRAMUSCULAR | Status: AC
  Filled 2021-08-08: qty 5

## 2021-08-08 MED ORDER — HYDROCODONE-ACETAMINOPHEN 5-325 MG PO TABS: 1.0000 | ORAL_TABLET | ORAL | Status: DC | PRN

## 2021-08-08 MED ORDER — ORAL CARE MOUTH RINSE: 15.0000 mL | Freq: Once | OROMUCOSAL | Status: AC

## 2021-08-08 MED ORDER — DOCUSATE SODIUM 100 MG PO CAPS
100.0000 mg | ORAL_CAPSULE | Freq: Two times a day (BID) | ORAL | Status: DC
  Administered 2021-08-08 – 2021-08-09 (×2): 100 mg via ORAL
  Filled 2021-08-08 (×2): qty 1

## 2021-08-08 MED ORDER — EPHEDRINE SULFATE-NACL 50-0.9 MG/10ML-% IV SOSY
PREFILLED_SYRINGE | INTRAVENOUS | Status: DC | PRN
  Administered 2021-08-08: 10 mg via INTRAVENOUS

## 2021-08-08 MED ORDER — MIDAZOLAM HCL 2 MG/2ML IJ SOLN
INTRAMUSCULAR | Status: DC | PRN
  Administered 2021-08-08 (×2): 1 mg via INTRAVENOUS

## 2021-08-08 MED ORDER — ONDANSETRON HCL 4 MG/2ML IJ SOLN
INTRAMUSCULAR | Status: DC | PRN
Start: 2021-08-08 — End: 2021-08-08
  Administered 2021-08-08: 4 mg via INTRAVENOUS

## 2021-08-08 MED ORDER — HYDROMORPHONE HCL 1 MG/ML IJ SOLN
INTRAMUSCULAR | Status: AC
  Filled 2021-08-08: qty 1

## 2021-08-08 MED ORDER — MELATONIN 5 MG PO TABS: 5.0000 mg | ORAL_TABLET | Freq: Every evening | ORAL | Status: DC | PRN

## 2021-08-08 MED ORDER — PROPOFOL 500 MG/50ML IV EMUL
INTRAVENOUS | Status: DC | PRN
Start: 2021-08-08 — End: 2021-08-08
  Administered 2021-08-08: 80 ug/kg/min via INTRAVENOUS

## 2021-08-08 MED ORDER — LIDOCAINE 2% (20 MG/ML) 5 ML SYRINGE
INTRAMUSCULAR | Status: AC
  Filled 2021-08-08: qty 5

## 2021-08-08 MED ORDER — OXYCODONE HCL 5 MG PO TABS
ORAL_TABLET | ORAL | Status: AC
  Filled 2021-08-08: qty 1

## 2021-08-08 MED ORDER — SODIUM CHLORIDE 0.9 % IV SOLN: INTRAVENOUS | Status: DC

## 2021-08-08 MED ORDER — ACETAMINOPHEN 160 MG/5ML PO SOLN: 650.0000 mg | ORAL | Status: DC | PRN

## 2021-08-08 MED ORDER — KETAMINE HCL 10 MG/ML IJ SOLN
INTRAMUSCULAR | Status: DC | PRN
  Administered 2021-08-08: 30 mg via INTRAVENOUS

## 2021-08-08 MED ORDER — CHLORHEXIDINE GLUCONATE 0.12 % MT SOLN
OROMUCOSAL | Status: AC
  Administered 2021-08-08: 15 mL via OROMUCOSAL
  Filled 2021-08-08: qty 15

## 2021-08-08 MED ORDER — LACTATED RINGERS IV SOLN: INTRAVENOUS | Status: DC | PRN

## 2021-08-08 MED ORDER — OYSTER SHELL CALCIUM/D3 500-5 MG-MCG PO TABS
2.0000 | ORAL_TABLET | Freq: Three times a day (TID) | ORAL | Status: DC
  Administered 2021-08-08 – 2021-08-09 (×2): 2 via ORAL
  Filled 2021-08-08 (×2): qty 2

## 2021-08-08 MED ORDER — ONDANSETRON HCL 4 MG PO TABS: 4.0000 mg | ORAL_TABLET | ORAL | Status: DC | PRN

## 2021-08-08 MED ORDER — SUGAMMADEX SODIUM 500 MG/5ML IV SOLN
INTRAVENOUS | Status: AC
  Filled 2021-08-08: qty 5

## 2021-08-08 MED ORDER — LACTATED RINGERS IV SOLN: INTRAVENOUS | Status: DC

## 2021-08-08 MED ORDER — ACETAMINOPHEN 650 MG RE SUPP: 650.0000 mg | RECTAL | Status: DC | PRN

## 2021-08-08 MED ORDER — DEXAMETHASONE SODIUM PHOSPHATE 10 MG/ML IJ SOLN
INTRAMUSCULAR | Status: DC | PRN
  Administered 2021-08-08: 10 mg via INTRAVENOUS

## 2021-08-08 MED ORDER — OXYCODONE HCL 5 MG/5ML PO SOLN: 5.0000 mg | Freq: Once | ORAL | Status: AC | PRN

## 2021-08-08 MED ORDER — PHENYLEPHRINE HCL-NACL 20-0.9 MG/250ML-% IV SOLN
INTRAVENOUS | Status: DC | PRN
  Administered 2021-08-08: 50 ug/min via INTRAVENOUS

## 2021-08-08 MED ORDER — ACETAMINOPHEN 10 MG/ML IV SOLN
INTRAVENOUS | Status: AC
  Filled 2021-08-08: qty 100

## 2021-08-08 MED ORDER — LEVOTHYROXINE SODIUM 100 MCG PO TABS
300.0000 ug | ORAL_TABLET | Freq: Every day | ORAL | Status: DC
  Administered 2021-08-09: 300 ug via ORAL
  Filled 2021-08-08: qty 3

## 2021-08-08 MED ORDER — ACETAMINOPHEN 10 MG/ML IV SOLN
INTRAVENOUS | Status: DC | PRN
Start: 2021-08-08 — End: 2021-08-08
  Administered 2021-08-08: 1000 mg via INTRAVENOUS

## 2021-08-08 MED ORDER — 0.9 % SODIUM CHLORIDE (POUR BTL) OPTIME
TOPICAL | Status: DC | PRN
  Administered 2021-08-08: 1000 mL

## 2021-08-08 MED ORDER — CHLORHEXIDINE GLUCONATE 0.12 % MT SOLN: 15.0000 mL | Freq: Once | OROMUCOSAL | Status: AC

## 2021-08-08 MED ORDER — CEFAZOLIN SODIUM-DEXTROSE 2-4 GM/100ML-% IV SOLN
INTRAVENOUS | Status: AC
  Filled 2021-08-08: qty 100

## 2021-08-08 MED ORDER — PROMETHAZINE HCL 25 MG/ML IJ SOLN: 6.2500 mg | INTRAMUSCULAR | Status: DC | PRN

## 2021-08-08 MED ORDER — GLYCOPYRROLATE PF 0.2 MG/ML IJ SOSY
PREFILLED_SYRINGE | INTRAMUSCULAR | Status: DC | PRN
Start: 2021-08-08 — End: 2021-08-08
  Administered 2021-08-08: .2 mg via INTRAVENOUS

## 2021-08-08 MED ORDER — OXYCODONE HCL 5 MG PO TABS
5.0000 mg | ORAL_TABLET | Freq: Once | ORAL | Status: AC | PRN
  Administered 2021-08-08: 5 mg via ORAL

## 2021-08-08 MED ORDER — DEXAMETHASONE SODIUM PHOSPHATE 10 MG/ML IJ SOLN
INTRAMUSCULAR | Status: AC
  Filled 2021-08-08: qty 1

## 2021-08-08 MED ORDER — LIDOCAINE 2% (20 MG/ML) 5 ML SYRINGE
INTRAMUSCULAR | Status: DC | PRN
  Administered 2021-08-08: 100 mg via INTRAVENOUS

## 2021-08-08 MED ORDER — PROPOFOL 1000 MG/100ML IV EMUL
INTRAVENOUS | Status: AC
  Filled 2021-08-08: qty 200

## 2021-08-08 MED ORDER — PHENYLEPHRINE 40 MCG/ML (10ML) SYRINGE FOR IV PUSH (FOR BLOOD PRESSURE SUPPORT)
PREFILLED_SYRINGE | INTRAVENOUS | Status: AC
  Filled 2021-08-08: qty 10

## 2021-08-08 MED ORDER — DEXMEDETOMIDINE (PRECEDEX) IN NS 20 MCG/5ML (4 MCG/ML) IV SYRINGE
PREFILLED_SYRINGE | INTRAVENOUS | Status: AC
  Filled 2021-08-08: qty 10

## 2021-08-08 MED ORDER — ONDANSETRON HCL 4 MG/2ML IJ SOLN
INTRAMUSCULAR | Status: AC
  Filled 2021-08-08: qty 2

## 2021-08-08 MED ORDER — FENTANYL CITRATE (PF) 250 MCG/5ML IJ SOLN
INTRAMUSCULAR | Status: AC
  Filled 2021-08-08: qty 5

## 2021-08-08 MED ORDER — ATORVASTATIN CALCIUM 10 MG PO TABS
20.0000 mg | ORAL_TABLET | Freq: Every day | ORAL | Status: DC
  Administered 2021-08-08 – 2021-08-09 (×2): 20 mg via ORAL
  Filled 2021-08-08 (×3): qty 2

## 2021-08-08 MED ORDER — FENTANYL CITRATE (PF) 250 MCG/5ML IJ SOLN
INTRAMUSCULAR | Status: DC | PRN
  Administered 2021-08-08 (×2): 100 ug via INTRAVENOUS
  Administered 2021-08-08: 50 ug via INTRAVENOUS

## 2021-08-08 MED ORDER — GLYCOPYRROLATE PF 0.2 MG/ML IJ SOSY
PREFILLED_SYRINGE | INTRAMUSCULAR | Status: AC
  Filled 2021-08-08: qty 1

## 2021-08-08 MED ORDER — ATROPINE SULFATE 0.4 MG/ML IV SOLN
INTRAVENOUS | Status: AC
  Filled 2021-08-08: qty 1

## 2021-08-08 MED ORDER — HYDROMORPHONE HCL 1 MG/ML IJ SOLN
0.2500 mg | INTRAMUSCULAR | Status: DC | PRN
  Administered 2021-08-08 (×2): 0.5 mg via INTRAVENOUS

## 2021-08-08 MED ORDER — PROPOFOL 10 MG/ML IV BOLUS
INTRAVENOUS | Status: AC
  Filled 2021-08-08: qty 20

## 2021-08-08 MED ORDER — CEFAZOLIN IN SODIUM CHLORIDE 3-0.9 GM/100ML-% IV SOLN
3.0000 g | INTRAVENOUS | Status: AC
  Administered 2021-08-08 (×2): 3 g via INTRAVENOUS
  Filled 2021-08-08: qty 100

## 2021-08-08 MED ORDER — OYSTER SHELL CALCIUM/D3 500-5 MG-MCG PO TABS: 2.0000 | ORAL_TABLET | Freq: Three times a day (TID) | ORAL | Status: DC

## 2021-08-08 MED ORDER — SUCCINYLCHOLINE CHLORIDE 200 MG/10ML IV SOSY
PREFILLED_SYRINGE | INTRAVENOUS | Status: AC
  Filled 2021-08-08: qty 10

## 2021-08-08 MED ORDER — ACETAMINOPHEN 10 MG/ML IV SOLN: 1000.0000 mg | Freq: Once | INTRAVENOUS | Status: DC | PRN

## 2021-08-08 MED ORDER — BENAZEPRIL HCL 5 MG PO TABS
40.0000 mg | ORAL_TABLET | Freq: Every day | ORAL | Status: DC
  Administered 2021-08-08 – 2021-08-09 (×2): 40 mg via ORAL
  Filled 2021-08-08 (×2): qty 8

## 2021-08-08 MED ORDER — MIDAZOLAM HCL 2 MG/2ML IJ SOLN
INTRAMUSCULAR | Status: AC
  Filled 2021-08-08: qty 2

## 2021-08-08 MED ORDER — DEXMEDETOMIDINE (PRECEDEX) IN NS 20 MCG/5ML (4 MCG/ML) IV SYRINGE
PREFILLED_SYRINGE | INTRAVENOUS | Status: DC | PRN
  Administered 2021-08-08: 8 ug via INTRAVENOUS
  Administered 2021-08-08: 4 ug via INTRAVENOUS
  Administered 2021-08-08: 8 ug via INTRAVENOUS

## 2021-08-08 MED ORDER — AMLODIPINE BESYLATE 5 MG PO TABS
5.0000 mg | ORAL_TABLET | Freq: Every day | ORAL | Status: DC
  Administered 2021-08-08 – 2021-08-09 (×2): 5 mg via ORAL
  Filled 2021-08-08 (×2): qty 1

## 2021-08-08 MED ORDER — BACITRACIN ZINC 500 UNIT/GM EX OINT: 1.0000 "application " | TOPICAL_OINTMENT | Freq: Three times a day (TID) | CUTANEOUS | Status: DC

## 2021-08-08 MED ORDER — SUCCINYLCHOLINE CHLORIDE 200 MG/10ML IV SOSY
PREFILLED_SYRINGE | INTRAVENOUS | Status: DC | PRN
  Administered 2021-08-08: 200 mg via INTRAVENOUS

## 2021-08-08 MED ORDER — PROPOFOL 10 MG/ML IV BOLUS
INTRAVENOUS | Status: DC | PRN
Start: 2021-08-08 — End: 2021-08-08
  Administered 2021-08-08: 50 mg via INTRAVENOUS
  Administered 2021-08-08: 150 mg via INTRAVENOUS

## 2021-08-08 MED ORDER — LIDOCAINE-EPINEPHRINE 1 %-1:100000 IJ SOLN
INTRAMUSCULAR | Status: DC | PRN
  Administered 2021-08-08: 10 mL

## 2021-08-08 MED ORDER — LIDOCAINE 2% (20 MG/ML) 5 ML SYRINGE
INTRAMUSCULAR | Status: AC
  Filled 2021-08-08: qty 10

## 2021-08-08 MED ORDER — ONDANSETRON HCL 4 MG/2ML IJ SOLN: 4.0000 mg | INTRAMUSCULAR | Status: DC | PRN

## 2021-08-08 SURGICAL SUPPLY — 49 items
BAG COUNTER SPONGE SURGICOUNT (BAG) ×5 IMPLANT
BLADE SURG 15 STRL LF DISP TIS (BLADE) ×1 IMPLANT
BLADE SURG 15 STRL SS (BLADE) ×4
CANISTER SUCT 3000ML PPV (MISCELLANEOUS) ×2 IMPLANT
CNTNR URN SCR LID CUP LEK RST (MISCELLANEOUS) IMPLANT
CONT SPEC 4OZ STRL OR WHT (MISCELLANEOUS)
CORD BIPOLAR FORCEPS 12FT (ELECTRODE) ×2 IMPLANT
COVER SURGICAL LIGHT HANDLE (MISCELLANEOUS) ×2 IMPLANT
DERMABOND ADVANCED (GAUZE/BANDAGES/DRESSINGS) ×1
DERMABOND ADVANCED .7 DNX12 (GAUZE/BANDAGES/DRESSINGS) ×1 IMPLANT
DRAIN JP 10F RND RADIO (DRAIN) ×1 IMPLANT
DRAPE HALF SHEET 40X57 (DRAPES) ×1 IMPLANT
DRSG TEGADERM 4X4.75 (GAUZE/BANDAGES/DRESSINGS) ×1 IMPLANT
ELECT COATED BLADE 2.86 ST (ELECTRODE) ×2 IMPLANT
ELECT REM PT RETURN 9FT ADLT (ELECTROSURGICAL) ×2
ELECTRODE REM PT RTRN 9FT ADLT (ELECTROSURGICAL) ×1 IMPLANT
EVACUATOR SILICONE 100CC (DRAIN) ×1 IMPLANT
FORCEPS BIPOLAR SPETZLER 8 1.0 (NEUROSURGERY SUPPLIES) ×2 IMPLANT
GAUZE 4X4 16PLY ~~LOC~~+RFID DBL (SPONGE) ×6 IMPLANT
GAUZE SPONGE 4X4 12PLY STRL (GAUZE/BANDAGES/DRESSINGS) ×1 IMPLANT
GLOVE SURG ENC MOIS LTX SZ6.5 (GLOVE) ×2 IMPLANT
GOWN STRL REUS W/ TWL LRG LVL3 (GOWN DISPOSABLE) ×2 IMPLANT
GOWN STRL REUS W/TWL LRG LVL3 (GOWN DISPOSABLE) ×4
HEMOSTAT ARISTA ABSORB 3G PWDR (HEMOSTASIS) ×1 IMPLANT
KIT BASIN OR (CUSTOM PROCEDURE TRAY) ×2 IMPLANT
KIT TURNOVER KIT B (KITS) ×2 IMPLANT
LOCATOR NERVE 3 VOLT (DISPOSABLE) IMPLANT
NDL HYPO 25GX1X1/2 BEV (NEEDLE) ×1 IMPLANT
NEEDLE HYPO 25GX1X1/2 BEV (NEEDLE) ×2 IMPLANT
NS IRRIG 1000ML POUR BTL (IV SOLUTION) ×2 IMPLANT
PAD ARMBOARD 7.5X6 YLW CONV (MISCELLANEOUS) ×4 IMPLANT
PENCIL SMOKE EVACUATOR (MISCELLANEOUS) ×2 IMPLANT
POSITIONER HEAD DONUT 9IN (MISCELLANEOUS) ×1 IMPLANT
PROBE NERVBE PRASS .33 (MISCELLANEOUS) ×2 IMPLANT
SET WALTER ACTIVATION W/DRAPE (SET/KITS/TRAYS/PACK) ×1 IMPLANT
SHEARS HARMONIC 9CM CVD (BLADE) ×2 IMPLANT
SPONGE INTESTINAL PEANUT (DISPOSABLE) ×12 IMPLANT
STAPLER VISISTAT 35W (STAPLE) IMPLANT
SUT CHROMIC 4 0 PS 2 18 (SUTURE) IMPLANT
SUT MNCRL AB 4-0 PS2 18 (SUTURE) ×1 IMPLANT
SUT SILK 3 0 PS 1 (SUTURE) IMPLANT
SUT SILK 3 0 REEL (SUTURE) ×2 IMPLANT
SUT SILK 3 0SH CR/8 30 (SUTURE) ×2 IMPLANT
SUT VIC AB 3-0 SH 27 (SUTURE) ×10
SUT VIC AB 3-0 SH 27X BRD (SUTURE) ×1 IMPLANT
SUT VICRYL 4-0 PS2 18IN ABS (SUTURE) ×3 IMPLANT
TOWEL GREEN STERILE FF (TOWEL DISPOSABLE) ×2 IMPLANT
TRAY ENT MC OR (CUSTOM PROCEDURE TRAY) ×2 IMPLANT
TUBE FEEDING 10FR FLEXIFLO (MISCELLANEOUS) IMPLANT

## 2021-08-08 NOTE — H&P (Signed)
Francisco Green is an 46 y.o. male.    Chief Complaint:  Multinodular goiter  HPI: Patient presents today for planned elective procedure.  He denies any interval change in history since office visit on 05/30/2021: Francisco Green is a 46 y.o. male who presents as a new patient, referred by Laurena Bering,*, for evaluation and treatment of thyromegaly and multinodular goiter. Patient has history of large multinodular goiter, and has been followed by endocrinology for several years. His most recent thyroid ultrasound was performed in 2021, which reported right thyroid lobe measuring 6 x 6 x 7 cm with a nodule measuring 5.35 cm in greatest dimension, and left thyroid lobe measuring 5 x 5 x 6 cm with largest nodule measuring 5.25 cm in greatest dimension. Dominant nodule on the right had previously been biopsied, and was negative for malignancy. Patient is currently on 75 mcg of Synthroid, and his most recent TSH level was within normal limits. He endorses a history of snoring and symptoms of sleep disordered breathing. He states that his sleep study has been ordered but that this has not yet been scheduled. He denies dysphagia, odynophagia, shortness of breath or difficulty breathing.   Past Medical History:  Diagnosis Date   ADHD (attention deficit hyperactivity disorder)    diagnosed as a child, but pt not sure if he still has it   Gout    Hypertension    Hypothyroidism    Thyroid disease     Past Surgical History:  Procedure Laterality Date   groin abs Right 2018   drainage of groin abscess    Family History  Problem Relation Age of Onset   Hypertension Mother    Diabetes Father    Hypertension Father     Social History:  reports that he has never smoked. He has never used smokeless tobacco. He reports that he does not drink alcohol and does not use drugs.  Allergies:  Allergies  Allergen Reactions   Micardis [Telmisartan] Other (See Comments)    Sinus drainage causing  vomiting    Medications Prior to Admission  Medication Sig Dispense Refill   amLODipine-benazepril (LOTREL) 5-40 MG per capsule Take 1 capsule by mouth daily.      atorvastatin (LIPITOR) 20 MG tablet Take 20 mg by mouth daily.     ibuprofen (ADVIL) 400 MG tablet Take 400 mg by mouth every 4 (four) hours as needed for pain.     levothyroxine (SYNTHROID) 75 MCG tablet Take 75 mcg by mouth daily before breakfast.     MELATONIN PO Take 1 capsule by mouth at bedtime as needed (sleep).      Results for orders placed or performed during the hospital encounter of 08/06/21 (from the past 48 hour(s))  Basic metabolic panel per protocol     Status: None   Collection Time: 08/06/21  3:27 PM  Result Value Ref Range   Sodium 139 135 - 145 mmol/L   Potassium 4.4 3.5 - 5.1 mmol/L   Chloride 98 98 - 111 mmol/L   CO2 29 22 - 32 mmol/L   Glucose, Bld 92 70 - 99 mg/dL    Comment: Glucose reference range applies only to samples taken after fasting for at least 8 hours.   BUN 16 6 - 20 mg/dL   Creatinine, Ser 2.70 0.61 - 1.24 mg/dL   Calcium 9.9 8.9 - 35.0 mg/dL   GFR, Estimated >09 >38 mL/min    Comment: (NOTE) Calculated using the CKD-EPI Creatinine Equation (2021)  Anion gap 12 5 - 15    Comment: Performed at [facility], [address], [place], [place] [address]  CBC per protocol     Status: Abnormal   Collection Time: 08/06/21  3:27 PM  Result Value Ref Range   WBC 5.2 4.0 - 10.5 K/uL   RBC 5.83 (H) 4.22 - 5.81 MIL/uL   Hemoglobin 13.3 13.0 - 17.0 g/dL   HCT 34.1 93.7 - 90.2 %   MCV 78.6 (L) 80.0 - 100.0 fL   MCH 22.8 (L) 26.0 - 34.0 pg   MCHC 29.0 (L) 30.0 - 36.0 g/dL   RDW 40.9 73.5 - 32.9 %   Platelets 188 150 - 400 K/uL    Comment: REPEATED TO VERIFY   nRBC 0.0 0.0 - 0.2 %    Comment: Performed at [facility], [address], [place], [place] [address]  SARS CORONAVIRUS 2 (TAT 6-24 HRS) Nasopharyngeal Nasopharyngeal Swab     Status: None   Collection Time: 08/06/21   3:28 PM   Specimen: Nasopharyngeal Swab  Result Value Ref Range   SARS Coronavirus 2 NEGATIVE NEGATIVE    Comment: (NOTE) SARS-CoV-2 target nucleic acids are NOT DETECTED.  The SARS-CoV-2 RNA is generally detectable in upper and lower respiratory specimens during the acute phase of infection. Negative results do not preclude SARS-CoV-2 infection, do not rule out co-infections with other pathogens, and should not be used as the sole basis for treatment or other patient management decisions. Negative results must be combined with clinical observations, patient history, and epidemiological information. The expected result is Negative.  Fact Sheet for Patients: HairSlick.no  Fact Sheet for Healthcare Providers: quierodirigir.com  This test is not yet approved or cleared by the [place] FDA and  has been authorized for detection and/or diagnosis of SARS-CoV-2 by FDA under an Emergency Use Authorization (EUA). This EUA will remain  in effect (meaning this test can be used) for the duration of the COVID-19 declaration under Se ction 564(b)(1) of the Act, 21 U.S.C. section 360bbb-3(b)(1), unless the authorization is terminated or revoked sooner.  Performed at [facility], [address] [place], [place] [address]    No results found.  ROS: ROS  Blood pressure (!) 163/97, pulse 94, temperature 98.2 F (36.8 C), temperature source Oral, resp. rate 18, height 6\' 2"  (1.88 m), weight (!) 185.1 kg, SpO2 97 %.  PHYSICAL EXAM: Physical Exam Constitutional:      Appearance: He is obese.  HENT:     Head: Normocephalic and atraumatic.     Right Ear: External ear normal.     Left Ear: External ear normal.     Mouth/Throat:     Mouth: Mucous membranes are moist.  Eyes:     Extraocular Movements: Extraocular movements intact.  Pulmonary:     Effort: Pulmonary effort is normal.  Skin:    General: Skin is warm and dry.   Neurological:     Mental Status: He is alert.  Psychiatric:        Mood and Affect: Mood normal.        Behavior: Behavior normal.    Studies Reviewed: CT Neck with contrast reviewed   Assessment/Plan Francisco Green is a 46 y.o. male with history of multinodular goiter, intermittent symptoms of compression, hypothyroidism, probable obstructive sleep apnea, awaiting definitive sleep study.  -To OR today for total thyroidectomy. Risks including injury to surrounding structures, hoarseness, permanent hypocalcemia, benefits and expected postoperative course and  recovery were reviewed with patient, who expressed understanding and agreement.    Jery Hollern A Lilianah Buffin 08/08/2021, 8:19 AM

## 2021-08-08 NOTE — Transfer of Care (Signed)
Immediate Anesthesia Transfer of Care Note  Patient: Francisco Green  Procedure(s) Performed: TOTAL THYROIDECTOMY WITH LARYNGEAL NERVE MONITORING (Neck)  Patient Location: PACU  Anesthesia Type:General  Level of Consciousness: alert   Airway & Oxygen Therapy: Patient Spontanous Breathing and Patient connected to face mask oxygen  Post-op Assessment: Report given to RN and Post -op Vital signs reviewed and stable  Post vital signs: Reviewed and stable  Last Vitals:  Vitals Value Taken Time  BP 143/82 08/08/21 1515  Temp    Pulse 103 08/08/21 1515  Resp 25 08/08/21 1515  SpO2 98 % 08/08/21 1515  Vitals shown include unvalidated device data.  Last Pain:  Vitals:   08/08/21 0655  TempSrc:   PainSc: 0-No pain      Patients Stated Pain Goal: 0 ([id number])  Complications: No notable events documented.

## 2021-08-08 NOTE — Op Note (Signed)
OPERATIVE NOTE  Francisco Green Date/Time of Admission: 08/08/2021  6:37 AM  CSN: [id number];KWI:[id number] Attending Provider: Cheron Schaumann A, DO Room/Bed: MCPO/NONE DOB: 1976-04-28 Age: 46 y.o.   Pre-Op Diagnosis: multinodular goiter  Post-Op Diagnosis: multinodular goiter  Procedure: Procedure(s): TOTAL THYROIDECTOMY WITH LARYNGEAL NERVE MONITORING  Anesthesia: General  Surgeon(s): Jacobey Gura A Shawnee Gambone, DO  Assist: Christia Reading, MD  Staff: Circulator: Keenan Bachelor, RN; Harriet Butte, RN Relief Circulator: Hermelinda Dellen, RN Relief Scrub: Lupita Dawn, RN; Izell [place] Scrub Person: Debbrah Alar Vendor Representative : Langley Adie  Implants: * No implants in log *  Specimens: ID Type Source Tests Collected by Time Destination  1 : Right thyroid lobe Tissue PATH ENT excision SURGICAL PATHOLOGY [name] A, DO 08/08/2021 1209   2 : Left thyroid lobe Tissue PATH ENT excision SURGICAL PATHOLOGY [name] A, DO 08/08/2021 1402     Complications: None  EBL: 150 ML  IVF: 2000 ML  Condition: stable  Operative Findings:   2 of  the 4 parathyroid gland were identified and were nerurovascularly intact. The bilateral recurrent laryngeal nerves were intact to visual examination.  Description of Operation: The patient was identified in the preoperative holding area.  Informed written consent including risks, benefits, alternatives, and possible complications including bleeding and nerve injury were obtained.  Patient was then taken, under the care of anesthesia, to the operating suite.  Patient was placed in the supine position on the operating table and then general anesthesia was administered using a NIMS endotracheal tube per anesthesia's protocol.  Incision site on anterior neck was marked and infiltrated with 10cc Lidocaine 1% with 1:100,000 epinephrine.  A transverse incision, approximately 9cm in length was made within a skin  crease with a 15 blade scalpel. The incision was carried down through the platysma. Inferiorly and superiorly based subplatysmal flaps were raised to the level of the clavicle and thyroid cartilage, respectively.  Four 2-0 silk sutures were used to retract the skin flaps at each corner of the incision. The midline raphe of the strap muscles was then identified and separated with the Bovie electrocautery in anatomic midline. The strap muscles were then dissected from the right  thyroid capsule. The strap muscles were retracted laterally. Decision was made to bisect the strap musculature due to size of the goiter. This was done using the harmonic. The superior pole vasculature was then identified and surrounding tissue was dissected free using both sharp and blunt instrumentation.  The superior pole vasculature was then clamped, cut, and ligated using Harmonic Scalpel.  The inferior pole was then identified and surrounding tissue were dissected free using both sharp and blunt instrumentation.  The inferior pole vessels were then clamped, cut, and ligated using Harmonic Scalpel. The thyroid was then retracted medially.  The middle thyroid vein was identified and ligated.  The recurrent laryngeal was then identified in the tracheoesophageal groove.  The nerve integrity monitoring probe was then used to stimulate the recurrent laryngeal nerve to ensure its identification and integrity.   The right   thyroid was removed from the anterior tracheal wall through Berry's Ligament. The thyroid lobe was then passed off the field to be evaluated by pathology. Similar procedure was then performed on the left.   Spot bipolar electrocautery was used to achieve meticulous hemostasis.  The wound bed was copiously irrigated and once again inspected for any bleeding.  There was none.  Arista was placed in the wound bed.  A 10 french JP  drain was passed through a separate stab incision inferior to the surgical site and sutured in place  using 2-0 silk suture. The strap muscles were then reapproximated using a 3-0 Vicryl suture and then reapproximated in the midline using 3-0 Vicryl in a running, locking fashion. The incision was then closed in a layered fashion. The platysma was closed with interrupted 3-0 Vicryl sutures.  The subcutaneous tissue was then closed with buried interrupted 4-0 vicryl sutures.  The skin was then closed with Dermabond.  All counts were correct and the procedure was terminated.  Assistance was required throughout the surgical procedure including surgical planning, retraction, management of bleeding and surgical decision-making throughout the operation.  Laren Boom, DO [facility]  08/08/2021

## 2021-08-08 NOTE — Anesthesia Procedure Notes (Addendum)
Procedure Name: Intubation Date/Time: 08/08/2021 8:57 AM Performed by: Eilene Ghazi, MD Pre-anesthesia Checklist: Patient identified, Emergency Drugs available, Suction available and Patient being monitored Patient Re-evaluated:Patient Re-evaluated prior to induction Oxygen Delivery Method: Circle system utilized Preoxygenation: Pre-oxygenation with 100% oxygen Induction Type: IV induction Ventilation: Oral airway inserted - appropriate to patient size, Two handed mask ventilation required, Nasal airway inserted- appropriate to patient size and Mask ventilation with difficulty Laryngoscope Size: Glidescope and 4 Grade View: Grade II Tube type: Oral Tube size: 8.0 mm Number of attempts: 1 Airway Equipment and Method: Stylet, Oral airway and Video-laryngoscopy Placement Confirmation: ETT inserted through vocal cords under direct vision, positive ETCO2 and breath sounds checked- equal and bilateral Secured at: 25 cm Tube secured with: Tape Dental Injury: Teeth and Oropharynx as per pre-operative assessment  Difficulty Due To: Difficulty was anticipated, Difficult Airway- due to large tongue, Difficult Airway- due to reduced neck mobility and Difficult Airway- due to limited oral opening Future Recommendations: Recommend- induction with short-acting agent, and alternative techniques readily available Comments: Pt required 4 hand mask with OPA and nasal airway. Difficulty was encountered opening the mouth and moving the tongue out of the way to insert glidescope blade. Glidescope used in anticipation of difficult airway.

## 2021-08-08 NOTE — Progress Notes (Signed)
ENT Post Operative Note  Subjective: Patient seen and examined at bedside. Reports pain is well controlled. Tolerating clear liquid diet without difficulty.   Vitals:   08/08/21 1650 08/08/21 1756  BP:  134/80  Pulse: (!) 101 92  Resp:  20  Temp:  98.3 F (36.8 C)  SpO2:  98%     OBJECTIVE  Gen: alert, cooperative, appropriate Head/ENT: EOMI, mucus membranes moist and pink, conjunctiva clear Midline neck incision C/D/I with tegaderm intact. Neck soft, with no evidence of seroma or hematoma. JP drain exiting midline neck with <10cc sanguinous drainage. Respiratory: Voice without dysphonia. Non-labored breathing, no accessory muscle use, good O2 saturations on room air Neuro: CN II-XII grossly intact  ASSESS/ PLAN  Francisco Green is a 46 y.o. male who is POD 0 from total thyroidectomy.  -Continue observation -Continue JP drain to bulb suction. Empty, record output, and recharge every 6 hours. -Continue MIVF- can saline lock when tolerating PO intake -Encourage IS use, ambulation to tolerance with assist -Pain control -Q8 Calcium checks, awaiting PTH -Oscal 500mg  TID  Thank you for allowing me to participate in the care of this patient. Please do not hesitate to contact me with any questions or concerns.   , DO Otolaryngology [facility] Cell: [phone]

## 2021-08-08 NOTE — Anesthesia Postprocedure Evaluation (Signed)
Anesthesia Post Note  Patient: Francisco Green  Procedure(s) Performed: TOTAL THYROIDECTOMY WITH LARYNGEAL NERVE MONITORING (Neck)     Patient location during evaluation: PACU Anesthesia Type: General Level of consciousness: awake and alert Pain management: pain level controlled Vital Signs Assessment: post-procedure vital signs reviewed and stable Respiratory status: spontaneous breathing, nonlabored ventilation, respiratory function stable and patient connected to nasal cannula oxygen Cardiovascular status: blood pressure returned to baseline and stable Postop Assessment: no apparent nausea or vomiting Anesthetic complications: no   No notable events documented.  Last Vitals:  Vitals:   08/08/21 1545 08/08/21 1600  BP: 128/81 115/76  Pulse: 92 89  Resp: 13 10  Temp:    SpO2: 95% 96%    Last Pain:  Vitals:   08/08/21 1600  TempSrc:   PainSc: Asleep                 Destyn Parfitt S

## 2021-08-08 NOTE — Anesthesia Preprocedure Evaluation (Signed)
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 Compared to measurements from ultrasound 10/23/2013, size has increased. 2. Moderate leftward deviation of the airway due to the right predominant multinodular goiter. No airway stenosis.  Dental no notable dental hx.    Pulmonary neg pulmonary ROS,    Pulmonary exam normal breath sounds clear to auscultation       Cardiovascular hypertension, Normal cardiovascular exam Rhythm:Regular Rate:Normal     Neuro/Psych negative neurological ROS  negative psych ROS   GI/Hepatic negative GI ROS, Neg liver ROS,   Endo/Other  Hypothyroidism Morbid obesity  Renal/GU negative Renal ROS  negative genitourinary   Musculoskeletal negative musculoskeletal ROS (+)   Abdominal   Peds negative pediatric ROS (+)  Hematology negative hematology ROS (+)   Anesthesia Other Findings   Reproductive/Obstetrics negative OB ROS                             Anesthesia Physical Anesthesia Plan  ASA: 3  Anesthesia Plan: General   Post-op Pain Management:    Induction: Intravenous  PONV Risk Score and Plan: 2 and Ondansetron, Dexamethasone and Treatment may vary due to age or medical condition  Airway Management Planned: Oral ETT and Video Laryngoscope Planned  Additional Equipment:   Intra-op Plan:   Post-operative Plan: Extubation in OR  Informed Consent: I have reviewed the patients History and Physical, chart, labs and discussed the procedure including the risks, benefits and alternatives for the proposed  anesthesia with the patient or authorized representative who has indicated his/her understanding and acceptance.     Dental advisory given  Plan Discussed with: CRNA and Surgeon  Anesthesia Plan Comments:         Anesthesia Quick Evaluation

## 2021-08-08 NOTE — Plan of Care (Signed)
  Problem: Nutrition: Goal: Adequate nutrition will be maintained Outcome: Progressing   Problem: Pain Managment: Goal: General experience of comfort will improve Outcome: Progressing   

## 2021-08-09 ENCOUNTER — Encounter (HOSPITAL_COMMUNITY): Payer: Self-pay | Admitting: Otolaryngology

## 2021-08-09 DIAGNOSIS — E042 Nontoxic multinodular goiter: Secondary | ICD-10-CM | POA: Diagnosis not present

## 2021-08-09 LAB — CALCIUM: Calcium: 8.3 mg/dL — ABNORMAL LOW (ref 8.9–10.3)

## 2021-08-09 LAB — PARATHYROID HORMONE, INTACT (NO CA): PTH: 11 pg/mL — ABNORMAL LOW (ref 15–65)

## 2021-08-09 LAB — ALBUMIN: Albumin: 3.3 g/dL — ABNORMAL LOW (ref 3.5–5.0)

## 2021-08-09 MED ORDER — OYSTER SHELL CALCIUM/D3 500-5 MG-MCG PO TABS: 2.0000 | ORAL_TABLET | Freq: Two times a day (BID) | ORAL | 0 refills | Status: AC

## 2021-08-09 MED ORDER — IBUPROFEN 400 MG PO TABS: 400.0000 mg | ORAL_TABLET | Freq: Four times a day (QID) | ORAL | 0 refills | Status: AC | PRN

## 2021-08-09 MED ORDER — HYDROCODONE-ACETAMINOPHEN 5-325 MG PO TABS: 1.0000 | ORAL_TABLET | Freq: Four times a day (QID) | ORAL | 0 refills | Status: AC | PRN

## 2021-08-09 MED ORDER — LEVOTHYROXINE SODIUM 300 MCG PO TABS: 300.0000 ug | ORAL_TABLET | Freq: Every day | ORAL | 1 refills | Status: AC

## 2021-08-09 MED ORDER — DOCUSATE SODIUM 100 MG PO CAPS
100.0000 mg | ORAL_CAPSULE | Freq: Two times a day (BID) | ORAL | 0 refills | Status: AC | PRN
Start: 2021-08-09 — End: 2021-08-16

## 2021-08-09 NOTE — Discharge Instructions (Signed)
[facility] THYROID / PARATHYROID SURGERY Post Operative Instructions Office Number [phone]  The Surgery Itself Thyroid surgery involves general anesthesia. Patients may be quite sedated for  several hours after surgery and may remain sleepy for much of the day. Nausea and vomiting is  occasionally seen, and usually resolves by the evening of surgery - even without additional  medications. Some patients stay one night in the hospital and are discharged the next day. Other  patients can go home the evening of surgery. Many patients will have a drain in place after  surgery-this is removed the following day before you go home from the hospital or in the office 1-3  days after surgery.   Your Incision Your incision is closed with absorbable sutures and is covered with a small strip of tape or skin  glue. You can shower and wash your hair as usual starting 24-48 hours after your drain is  removed, as directed by your surgeon. If you did not have a drain in place after surgery, you may  shower 24 hours after surgery. You may wash in a bathtub prior to that time if you are careful not  to get your neck wet. Do not soak or scrub the incision. You might notice bruising around your  incision or upper chest and slight swelling above the scar when you are upright. In addition, the  scar may become pink and hard. This hardening will peak at about 3 weeks and may result in  some tightness or difficulty swallowing, which will disappear over the next 2 to 3 months. You  should apply sunscreen on your incision once directed by your surgeon (usually starting one month  after surgery) EVERY day for the first year after surgery. This will prevent a red or pink scar and  give you the best cosmetic result for your scar. A daily moisturizer with sunscreen (example Oil of  Olay with SPF 15) is fine.   Limitations You can start resuming normal activities as tolerated 7 days after surgery. For some  patients, lifting  can cause pain and stretching at the surgery site for up to 2-3 weeks after surgery.  You should not drive or drink alcohol while taking pain medications. Most people can return to  work/school in 1-2 weeks after surgery, but there may be physical limitations as far as what you  may do while at work.   Medications ? Pain medication should be used for pain as prescribed. Pain is expected after surgery. Your  neck will be sore and pain will be worse when the neck is stretched and when you swallow.  As the surgical site heals, pain will resolve over the course of a week. It is not uncommon  for pain to get worse when you first go home because your activity may increase, but from  that point on the pain should improve every day. Pain medications can cause nausea,  which can be prevented if you take them with food or milk. ? You may be given a stool softener (Colace) because pain medications may make you  constipated. You can also use an over the counter stool softener.  ? You may be given Bacitracin ointment. If you are, it should be applied to the drain exit site  three times a day for 2 days after the drain is removed. It should also be applied to the  drain site before and after your first shower after surgery. It is normal to have some red   or  pink drainage from your drain exit site for 1-2 days after it is removed. ? If you were taking thyroid hormone tablets before your operation, you will continue this after  surgery, but sometimes your surgeon will change the dose. If you were not taking thyroid  hormone prior to your operation, your surgeon may prescribe these tablets following  surgery if the entire thyroid is removed. During your post-operative visits, you may have a  blood test to measure your levels of thyroid hormone and your dose of medication may be  adjusted accordingly. ? If you had parathyroid surgery or a total thyroidectomy, you may be instructed to take  extra  calcium supplements until your blood calcium levels stabilize. These usually have to be  purchased "over the counter" at a drug store and your surgeon will give you specific  instructions. Generic brands are fine. Calcium carbonate with Vitamin D or TUMS are  usually recommended. If you take any medications for gastric reflux (heartburn), you may  be instructed to take Calcium Citrate with vitamin D instead of the other types of calcium.  Your surgeon will instruct you on which type of calcium supplement to purchase and how  many tablets to take each day after surgery. ? Take all of your routine medications as prescribed, unless told otherwise by your surgeon.  Any medications which thin the blood should be avoided until your surgeon tells you to  resume them.  ? IT IS OK TO TAKE OVER THE COUNTER PAIN MEDICATION  (IBUPROFEN, NAPROXEN, or ACETAMINOPHEN) IN  ADDITION TO YOUR PRESCRIBED MEDICATIONS. DO NOT  TAKE ASPIRIN UNLESS CLEARED WITH YOUR SURGEON.  ? Limit Acetaminophen/Tylenol to less than 4,000mg/day  ? Limit Ibuprofen/Motrin to less than 3,600mg/day  Pain The main complaint following thyroid surgery is pain with swallowing and neck movement. Some  people experience a dull ache, while others feel a sharp pain. This should not keep you from eating  anything you want or moving your neck and will improve daily after surgery. It is normal to have a  sore throat as well.   Voice Your voice may go through some temporary changes with fluctuations in volume and clarity  (hoarseness). Generally, it will be better in the mornings and "tire" toward the end of the day. This  can last for variable periods of time, but should clear in 8-10 weeks at most.   Cough You may feel like you have phlegm in your throat or a sore throat. This is usually because there  was a tube in your windpipe while you were asleep that caused irritation that you perceive as  phlegm. You will notice that if you  cough, very little phlegm will come up. This should clear up in 4  to 5 days.  Hypocalcemia (Low blood calcium) In some patients who have thyroid and parathyroid surgery, the parathyroid glands do not function  properly immediately after surgery. This is usually temporary and causes the blood calcium level to  drop below normal (hypocalcemia). Symptoms of hypocalcemia include numbness and tingling of  your lips (like they fell asleep), in your hands and in your feet. Some patients experience a  "crawling" sensation in the skin, muscle cramps or headaches. These symptoms can appear  between 24 and 72 hours after surgery. It is rare for them to start more than 72 hours after surgery.  If this happens, take 2 extra calcium tablets. If the symptoms do not resolve within one hour, you  should   call your doctor. If this happens during the business day, call the nurse triage line. If this  happens in the evening or over the weekend, call the on call physician or go to the ER so your  blood calcium levels can be checked. You can take extra calcium supplements (which will help to  resolve symptoms) as you are contacting the clinic or coming to the ER. Taking extra calcium  supplements if you do not need them will not cause you any harm.  Reasons to call your surgeon's office ? Persistent fever over 101 F ? Increasing neck swelling ? Numbness or tingling around your mouth or fingertips  ? Pain that is not relieved by your medications ? Purulent drainage (pus) from the incision ? Redness surrounding the incision that is worsening or getting bigger ? Bleeding is possible after surgery and the most serious cases may cause trouble breathing.  Symptoms include rapid swelling in the neck, trouble breathing, red and purple discoloration of  the skin over the incision. If breathing difficulty occurs with this rapid neck swelling, call the  doctor immediately, go to the closest emergency room or call 911. 

## 2021-08-09 NOTE — Discharge Summary (Signed)
Physician Discharge Summary  Patient ID: Francisco Green MRN: [id number] DOB/AGE: 08/17/1975 46 y.o.  Admit date: 08/08/2021 Discharge date: 08/09/2021  Admission Diagnoses:  Principal Problem:   Multinodular goiter   Discharge Diagnoses:  Same  Surgeries: Procedure(s): TOTAL THYROIDECTOMY WITH LARYNGEAL NERVE MONITORING on 08/08/2021   Consultants: None  Discharged Condition: Improved  Hospital Course: Francisco Green is an 46 y.o. male who was admitted 08/08/2021 with a diagnosis of Multinodular goiter.  They were brought to the operating room on 08/08/2021 and underwent the above named procedures.  Patient had stable postoperative course. On POD #1, patient was ambulating, tolerating PO, and calcium levels were noted to be stable. JP drain removed, postop instructions were reviewed with patient.   Physical Exam:  General: Awake and alert, no acute distress Neck: Neck soft, no evidence of seroma or hematoma. JP drain with 10cc serosanguinous drainage Respiratory: Respiratory effort is normal.   Recent vital signs:  Vitals:   08/09/21 0546 08/09/21 0744  BP: 113/64 102/61  Pulse: 98 92  Resp: 20 18  Temp: 98.5 F (36.9 C) 98.4 F (36.9 C)  SpO2: 92% 92%    Recent laboratory studies:  Results for orders placed or performed during the hospital encounter of 08/08/21  Calcium  Result Value Ref Range   Calcium 8.8 (L) 8.9 - 10.3 mg/dL  Albumin  Result Value Ref Range   Albumin 3.6 3.5 - 5.0 g/dL  Calcium  Result Value Ref Range   Calcium 8.9 8.9 - 10.3 mg/dL  Albumin  Result Value Ref Range   Albumin 3.6 3.5 - 5.0 g/dL  Albumin  Result Value Ref Range   Albumin 3.3 (L) 3.5 - 5.0 g/dL  Calcium  Result Value Ref Range   Calcium 8.3 (L) 8.9 - 10.3 mg/dL    Discharge Medications:   Allergies as of 08/09/2021       Reactions   Micardis [telmisartan] Other (See Comments)   Sinus drainage causing vomiting        Medication List     TAKE these medications     amLODipine-benazepril 5-40 MG capsule Commonly known as: LOTREL Take 1 capsule by mouth daily.   atorvastatin 20 MG tablet Commonly known as: LIPITOR Take 20 mg by mouth daily.   calcium-vitamin D 500-5 MG-MCG tablet Commonly known as: OSCAL WITH D Take 2 tablets by mouth 2 (two) times daily.   docusate sodium 100 MG capsule Commonly known as: COLACE Take 1 capsule (100 mg total) by mouth 2 (two) times daily as needed for up to 7 days for mild constipation.   HYDROcodone-acetaminophen 5-325 MG tablet Commonly known as: NORCO/VICODIN Take 1 tablet by mouth every 6 (six) hours as needed for up to 5 days for moderate pain.   ibuprofen 400 MG tablet Commonly known as: ADVIL Take 1 tablet (400 mg total) by mouth every 6 (six) hours as needed. What changed:  when to take this reasons to take this   levothyroxine 300 MCG tablet Commonly known as: SYNTHROID Take 1 tablet (300 mcg total) by mouth daily at 6 (six) AM. Start taking on: August 10, 2021 What changed:  medication strength how much to take when to take this   MELATONIN PO Take 1 capsule by mouth at bedtime as needed (sleep).        Diagnostic Studies: No results found.  Disposition: Discharge disposition: 01-Home or Self Care          Follow-up Information     Drumright,  Tad Fancher A, DO Follow up on 08/21/2021.   Specialty: Otolaryngology Why: Follow up as scheduled on 02/02 Contact information: [place] [phone]                  Signed: Trinda Pascal A Murillo 08/09/2021, 11:15 AM

## 2021-08-09 NOTE — Progress Notes (Signed)
Patient discharged to home with instructions and verbalized understanding. 

## 2021-08-10 ENCOUNTER — Other Ambulatory Visit: Payer: Self-pay

## 2021-08-11 LAB — SURGICAL PATHOLOGY

## 2021-08-11 LAB — PARATHYROID HORMONE, INTACT (NO CA): PTH: 13 pg/mL — ABNORMAL LOW (ref 15–65)

## 2023-03-23 IMAGING — CT CT NECK W/ CM
3 of 4 series · 6 of 14 positions shown, 7 images · IV contrast (iopamidol)
Comparison: Thyroid ultrasound 10/23/2013

CLINICAL DATA: Multinodular goiter EQC.G (T3D-MV-CM)

EXAM:
CT NECK WITH CONTRAST
TECHNIQUE: Multidetector CT imaging of the neck was performed using the
standard protocol following the bolus administration of intravenous
contrast.
CONTRAST:  75mL 8M8DJF-3NN IOPAMIDOL (8M8DJF-3NN) INJECTION 61%

[Series 3: neck · axial · 0.60mm/px · z∈[-324,-222]mm · 2 of 155 slices shown, 3 images]
[im 52/155  soft-tissue]
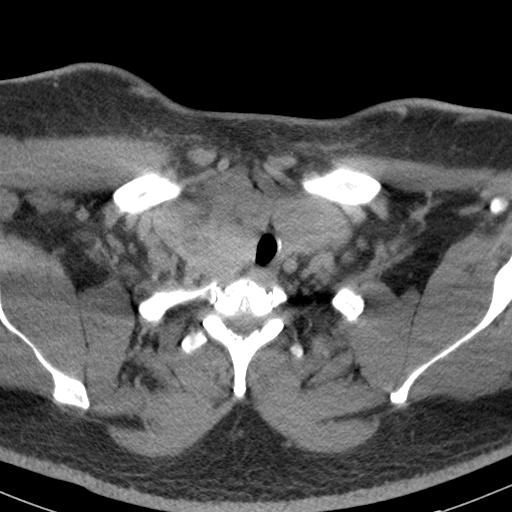
[im 52/155  bone]
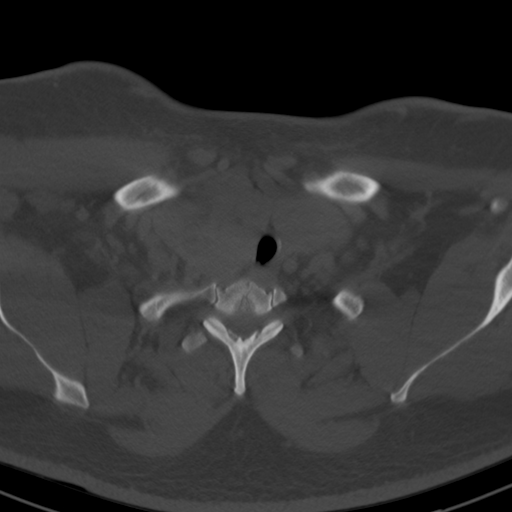
[im 103/155  bone]
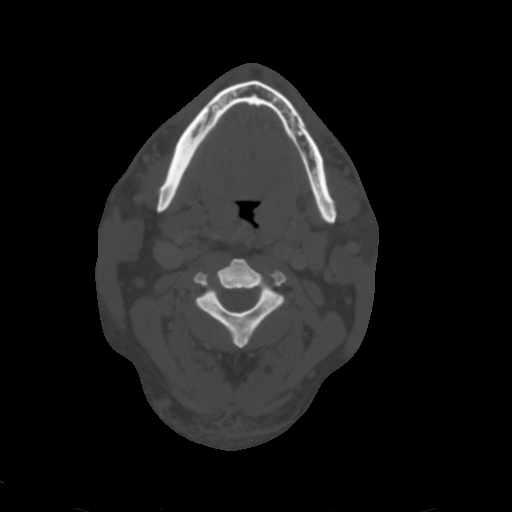

[Series 8: angled axial-oropharynx · axial · 0.51mm/px · z∈[-322,-220]mm · 2 of 153 slices shown]
[im 51/153  bone]
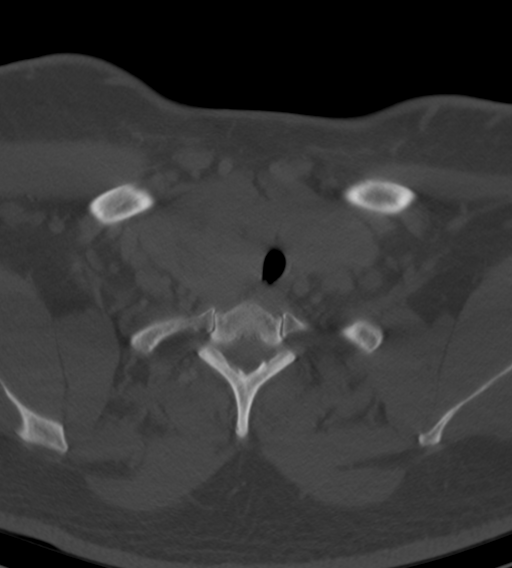
[im 102/153  bone]
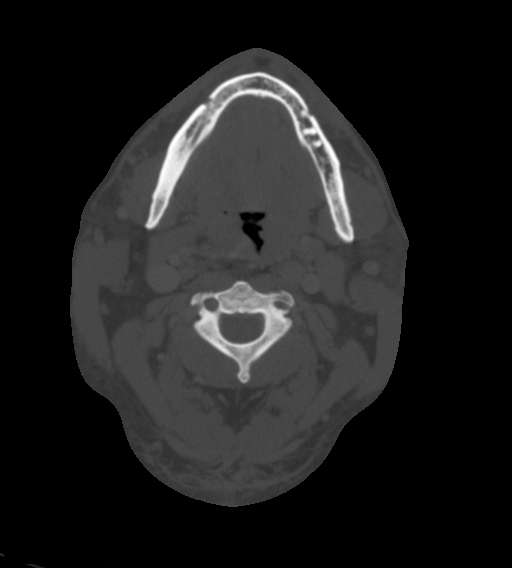

[Series 9: angled (person_name) · axial · 0.51mm/px · z∈[-322,-220]mm · 2 of 153 slices shown]
[im 51/153  bone]
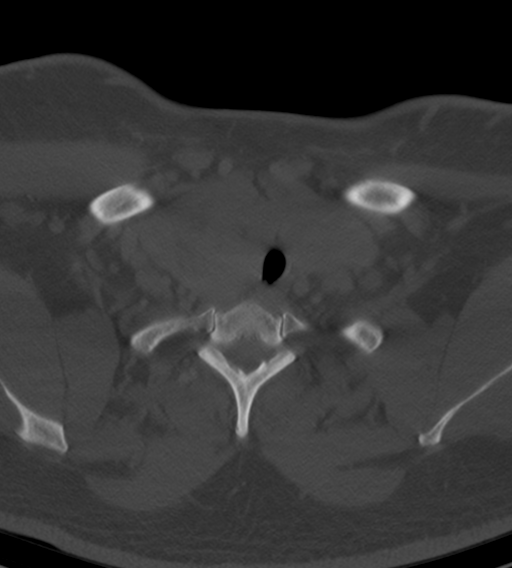
[im 102/153  bone]
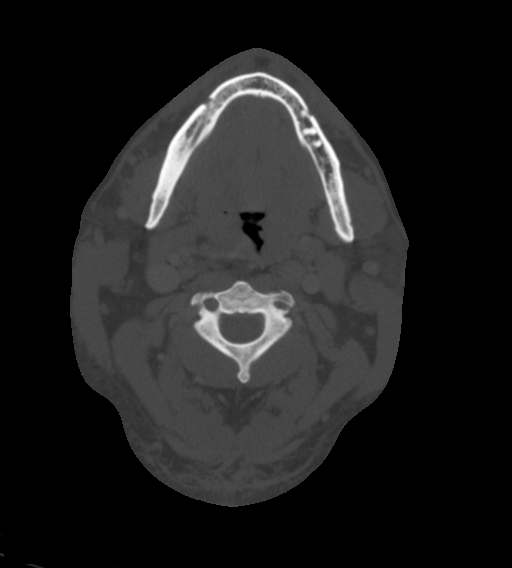

[6 of 14 positions shown; findings below may reference images not displayed]

FINDINGS: Pharynx and larynx: There is no pharyngeal or laryngeal mass. There
is moderate leftward deviation of the airway due to the right
predominant multinodular goiter.

Salivary glands: No inflammation, mass, or stone.

Thyroid: Large multinodular goiter, right-sided dominant. The right
lobe measures 13.6 x 6.7 x 7.1 cm. On prior ultrasound, this was
measured as 11.0 x 5.0 x 5.9 cm. There is mediastinal extension to
the level of the innominate artery.

Lymph nodes: None enlarged or abnormal density.

Vascular: Negative.

Limited intracranial: Negative.

Visualized orbits: Negative.

Mastoids and visualized paranasal sinuses: Clear.

Skeleton: No acute or aggressive process.

Upper chest: Negative.

Other: None.
IMPRESSION: 1. Large multinodular goiter, right-sided dominant, with mediastinal
extension to the level of the innominate artery. Compared to
measurements from ultrasound 10/23/2013, size has increased.
2. Moderate leftward deviation of the airway due to the right
predominant multinodular goiter. No airway stenosis.
